# Patient Record
Sex: Male | Born: 1935 | Race: White | Hispanic: No | Marital: Married | Smoking: Never smoker
Health system: Southern US, Community
[De-identification: ages and names within clinical notes are randomized; demographics above are authoritative.]

## PROBLEM LIST (undated history)

## (undated) DIAGNOSIS — G709 Myoneural disorder, unspecified: Secondary | ICD-10-CM

## (undated) DIAGNOSIS — M1990 Unspecified osteoarthritis, unspecified site: Secondary | ICD-10-CM

## (undated) DIAGNOSIS — K219 Gastro-esophageal reflux disease without esophagitis: Secondary | ICD-10-CM

## (undated) DIAGNOSIS — E7800 Pure hypercholesterolemia, unspecified: Secondary | ICD-10-CM

## (undated) DIAGNOSIS — I10 Essential (primary) hypertension: Secondary | ICD-10-CM

## (undated) DIAGNOSIS — E039 Hypothyroidism, unspecified: Secondary | ICD-10-CM

## (undated) HISTORY — PX: JOINT REPLACEMENT: SHX530

## (undated) HISTORY — PX: CHOLECYSTECTOMY: SHX55

## (undated) HISTORY — PX: TOTAL HIP ARTHROPLASTY: SHX124

## (undated) HISTORY — PX: TRANSURETHRAL RESECTION OF PROSTATE: SHX73

## (undated) HISTORY — PX: COLONOSCOPY: SHX174

## (undated) HISTORY — PX: EYE SURGERY: SHX253

---

## 1997-09-30 ENCOUNTER — Other Ambulatory Visit: Admission: RE | Admit: 1997-09-30 | Discharge: 1997-09-30 | Payer: Self-pay | Admitting: Internal Medicine

## 1998-01-19 ENCOUNTER — Inpatient Hospital Stay (HOSPITAL_COMMUNITY): Admission: RE | Admit: 1998-01-19 | Discharge: 1998-01-21 | Payer: Self-pay | Admitting: Urology

## 2000-03-13 HISTORY — PX: SHOULDER ARTHROSCOPY: SHX128

## 2000-08-22 ENCOUNTER — Encounter: Payer: Self-pay | Admitting: Orthopedic Surgery

## 2000-08-29 ENCOUNTER — Inpatient Hospital Stay (HOSPITAL_COMMUNITY): Admission: RE | Admit: 2000-08-29 | Discharge: 2000-08-30 | Payer: Self-pay | Admitting: Orthopedic Surgery

## 2002-04-16 ENCOUNTER — Encounter: Payer: Self-pay | Admitting: Internal Medicine

## 2002-04-16 ENCOUNTER — Ambulatory Visit (HOSPITAL_COMMUNITY): Admission: RE | Admit: 2002-04-16 | Discharge: 2002-04-16 | Payer: Self-pay | Admitting: Internal Medicine

## 2003-03-14 HISTORY — PX: SHOULDER ARTHROSCOPY: SHX128

## 2003-04-17 ENCOUNTER — Encounter (INDEPENDENT_AMBULATORY_CARE_PROVIDER_SITE_OTHER): Payer: Self-pay | Admitting: Specialist

## 2003-04-17 ENCOUNTER — Inpatient Hospital Stay (HOSPITAL_COMMUNITY): Admission: RE | Admit: 2003-04-17 | Discharge: 2003-04-20 | Payer: Self-pay | Admitting: Urology

## 2003-06-10 ENCOUNTER — Encounter: Admission: RE | Admit: 2003-06-10 | Discharge: 2003-06-10 | Payer: Self-pay | Admitting: Orthopedic Surgery

## 2003-07-13 ENCOUNTER — Observation Stay (HOSPITAL_COMMUNITY): Admission: RE | Admit: 2003-07-13 | Discharge: 2003-07-14 | Payer: Self-pay | Admitting: Orthopedic Surgery

## 2005-06-19 ENCOUNTER — Ambulatory Visit: Payer: Self-pay | Admitting: Gastroenterology

## 2005-06-22 ENCOUNTER — Encounter (INDEPENDENT_AMBULATORY_CARE_PROVIDER_SITE_OTHER): Payer: Self-pay | Admitting: Specialist

## 2005-06-22 ENCOUNTER — Ambulatory Visit: Payer: Self-pay | Admitting: Gastroenterology

## 2008-05-22 ENCOUNTER — Encounter: Admission: RE | Admit: 2008-05-22 | Discharge: 2008-05-22 | Payer: Self-pay | Admitting: Neurology

## 2008-10-01 ENCOUNTER — Emergency Department (HOSPITAL_COMMUNITY): Admission: EM | Admit: 2008-10-01 | Discharge: 2008-10-01 | Payer: Self-pay | Admitting: Emergency Medicine

## 2008-12-19 ENCOUNTER — Encounter: Admission: RE | Admit: 2008-12-19 | Discharge: 2008-12-19 | Payer: Self-pay | Admitting: Internal Medicine

## 2010-06-19 LAB — URINALYSIS, ROUTINE W REFLEX MICROSCOPIC
Bilirubin Urine: NEGATIVE
Glucose, UA: NEGATIVE mg/dL
Hgb urine dipstick: NEGATIVE
Ketones, ur: NEGATIVE mg/dL
Protein, ur: NEGATIVE mg/dL
Urobilinogen, UA: 0.2 mg/dL (ref 0.0–1.0)

## 2010-06-19 LAB — URINE CULTURE: Colony Count: 60000

## 2010-06-19 LAB — DIFFERENTIAL
Basophils Absolute: 0 10*3/uL (ref 0.0–0.1)
Basophils Relative: 0 % (ref 0–1)
Eosinophils Relative: 4 % (ref 0–5)
Monocytes Absolute: 0.6 10*3/uL (ref 0.1–1.0)
Neutro Abs: 5.1 10*3/uL (ref 1.7–7.7)

## 2010-06-19 LAB — CBC
Hemoglobin: 12.2 g/dL — ABNORMAL LOW (ref 13.0–17.0)
MCHC: 34.5 g/dL (ref 30.0–36.0)
Platelets: 200 10*3/uL (ref 150–400)
RDW: 13.8 % (ref 11.5–15.5)

## 2010-06-19 LAB — POCT I-STAT, CHEM 8
Creatinine, Ser: 1.4 mg/dL (ref 0.4–1.5)
Glucose, Bld: 106 mg/dL — ABNORMAL HIGH (ref 70–99)
Hemoglobin: 11.9 g/dL — ABNORMAL LOW (ref 13.0–17.0)
Sodium: 142 mEq/L (ref 135–145)
TCO2: 29 mmol/L (ref 0–100)

## 2010-06-19 LAB — URINE MICROSCOPIC-ADD ON

## 2010-06-19 LAB — PROTIME-INR: Prothrombin Time: 14.4 seconds (ref 11.6–15.2)

## 2010-07-29 NOTE — H&P (Signed)
[facility]  Patient:    Robert Preston, Robert Preston                      MRN: [id number] Adm. Date:  08/29/00 Attending:  Ollen Gross, M.D. Dictator:   Druscilla Brownie. Shela Nevin, P.A. CC:         Marinus Maw, M.D.   History and Physical  DATE OF BIRTH:  04/04/1922  CHIEF COMPLAINT:  "Problems with my right shoulder."  PRESENT ILLNESS:  This 77 year old white male has been seen by Dr. Ollen Gross for continuing progressive problems concerning his right shoulder.  The patient has had on-and-off shoulder pain for a while and had a steroid injection in the past.  He was playing some golf in the end of February and felt a pop in the right shoulder as well as onset of pain; since then, he has had progressive problems with weakness, difficulty to abduct the shoulder.  MRI has shown a full-thickness and partially retracted tear of the distal supraspinatus tendon; effusion is also seen.  Due to these positive findings and the fact this is a very active gentleman, feel he would benefit from surgical intervention and being admitted for right shoulder arthroscopy.  PAST MEDICAL HISTORY:  This gentleman has really been in good health throughout his lifetime.  He has hypertension and hypercholesterolemia. Surgically, he has had bilateral total hip replacement arthroplasties, one in 1987 and one in 1994, a cholecystectomy in 1987 and TUR in 1998. Dr. Marinus Maw is his family physician.  CURRENT MEDICATIONS: 1. Zocor 80 mg one-half tab q.d. 2. Terazosin 10 mg one q.d.  ALLERGIES:  He has no medical allergies.  SOCIAL HISTORY:  The patient has two or three alcoholic beverages weekly, has no intake of tobacco products.  FAMILY HISTORY:  Positive for renal failure in his mother, coronary artery disease and MI in the father, sister with cancer and renal failure and sister with bacterial endocarditis, all four deceased.  REVIEW OF SYSTEMS:  CNS:  No  seizure disorder, paralysis, numbness or double vision.  RESPIRATORY:  No productive cough.  No hemoptysis.  No shortness of breath.  CARDIOVASCULAR:  No chest pain.  No angina.  No orthopnea. GASTROINTESTINAL:  No nausea, vomiting, melena or bloody stools. GENITOURINARY:  No discharge, dysuria or hematuria.  MUSCULOSKELETAL: Primarily in the present illness with the right shoulder.  PHYSICAL EXAMINATION:  GENERAL:  Alert and cooperative, friendly 77 year old white male whose vital signs are:  Blood pressure 160/76.  Pulse 74.  Respirations are 12.  HEENT:  Normocephalic.  PERRLA.  EOM intact.  Oropharynx is clear.  CHEST:  Clear to auscultation.  No rhonchi nor rales.  HEART:  Regular rate and rhythm.  No murmurs are heard.  ABDOMEN:  Soft, nontender.  Liver and spleen not felt.  Obese.  GENITALIA/RECTAL:  Not done, not pertinent to present illness.  EXTREMITIES:  The patient has pain with abduction and range of motion, has marked crepitus throughout range of motion.  Strength is 4/5 with respect to testing of the right rotator cuff.  ADMITTING DIAGNOSES: 1. Tear of the rotator cuff of the right shoulder with impingement and    acromioclavicular arthrosis. 2. Hypertension. 3. Hypercholesterolemia.  PLAN:  The patient will undergo a right shoulder arthroscopy with subacromial decompression, distal clavicle resection and rotator cuff repair.  He will probably have an overnight stay. DD:  08/22/00 TD:  08/22/00 Job: [id number]

## 2010-07-29 NOTE — Discharge Summary (Signed)
NAMESHAMOND, SKELTON                          ACCOUNT NO.:  [id number]   MEDICAL RECORD NO.:  [id number]                   PATIENT TYPE:  INP   LOCATION:  0379                                 FACILITY:  [facility]   PHYSICIAN:  Jamison Neighbor, M.D.               DATE OF BIRTH:  [birth year]   DATE OF ADMISSION:  04/17/2003  DATE OF DISCHARGE:  04/20/2003                                 DISCHARGE SUMMARY   DISCHARGE DIAGNOSES:  1. Benign prostatic hypertrophy with bladder outlet obstruction.  2. Hypertension.  3. Past history of joint replacement.   PRINCIPAL PROCEDURE:  TURP done on the day of admission.   HISTORY:  This 77 year old male has had problems with BPH and bladder outlet  obstruction and also had some problems with hematuria.  The patient was  recently evaluated in the office and was found to have __________ blood in  the urine and for that reason, undergo repeat cystoscopy.  The patient is  being admitted following that procedure.   The patient's past medical history, family history, social history, and  review of systems are well-defined in the hospital records.  Principal  findings include __________ and include a cholecystectomy, bilateral hip  surgery, right shoulder surgery, previous TURP, and LASIK surgery on the  eyes.   MEDICATIONS AT TIME OF ADMISSION:  Cardura, Zocor, and atenolol.  The  patient has stopped aspirin.   SOCIAL HISTORY:  Negative.  The patient does not use tobacco, drinks modest  amounts of alcohol.   REVIEW OF SYSTEMS:  Noncontributory.   The patient was taken to the operating room where he underwent a TURP.  The  patient had significant tissue that was removed.  Even though he had had a  previous TURP, there was major regrowth, primarily on one side.  The patient  was ready for discharge by April 20, 2003.  His Foley was removed, and he  was able to void without difficulty.  He was sent home with Septra and  Lorcet Plus and will return in  follow-up in two weeks' time.                                               Jamison Neighbor, M.D.    RJE/MEDQ  D:  05/11/2003  T:  05/11/2003  Job:  [id number]

## 2010-07-29 NOTE — Op Note (Signed)
Robert Preston, Robert Preston                          ACCOUNT NO.:  [id number]   MEDICAL RECORD NO.:  [id number]                   PATIENT TYPE:  AMB   LOCATION:  DAY                                  [facility]ACILITY:  [facility]   PHYSICIAN:  Jamison Neighbor, M.D.               DATE OF BIRTH:  03/15/1922   DATE OF PROCEDURE:  04/17/2003  DATE OF DISCHARGE:                                 OPERATIVE REPORT   SERVICE:  Urology.   PREOPERATIVE DIAGNOSES:  Benign prostatic hypertrophy with bladder outlet  obstruction. Secondary diagnosis is hematuria.   POSTOPERATIVE DIAGNOSES:  Benign prostatic hypertrophy with bladder outlet  obstruction. Secondary diagnosis is hematuria.   PROCEDURE:  Cystoscopy and TURP.   SURGEON:  Jamison Neighbor, M.D.   ANESTHESIA:  General.   COMPLICATIONS:  None.   DRAINS:  A 24 French three-way Foley catheter.   BRIEF HISTORY:  This 77 year old male had a TURP done back in 1999.  For  many years, he had excellent control of his urine with good outflow but  recently he has begun to develop problems with both bladder outlet  obstruction and the development of hematuria.  A rectal examination showed  there had been some regrowth of the prostate and a cystoscopic examination  late in 2004 demonstrated that the blood did appear to be coming from the  prostate. The patient was noted to have regrowth of prostate much more on  the left than on the right. The patient was started on Proscar which did  help to stop the hematuria but his bladder outlet symptoms have not improved  and he is now to the point where he is not emptying his bladder well. The  patient has requested a repeat TURP be performed. He understands the risks  and benefits of the procedure and gave informed consent.   DESCRIPTION OF PROCEDURE:  After successful induction of general anesthesia,  the patient was placed in the dorsal lithotomy position, prepped with  Betadine and draped in the usual sterile fashion.   The urethra was  calibrated at 30 [place] with [employer] sounds. The Olympus continuous flow  resectoscope sheath was then inserted into the bladder with a [place]lake  obturator. A __________ resectoscope was inserted with the 12 degree lens in  place. Inspection showed that the bladder was unremarkable, the ureters were  back from the bladder neck, the patient primarily had regrowth on the right  hand side with a very large lobe of tissue falling down from the lateral  aspect and filling much of the bladder outlet. The prostatic flow was good,  there was some regrowth on the right hand side. The resection started at the  1 o'clock position and extended down to the floor of the prostate. This  allowed for harvesting of all this recurrent tissue on the left hand side. A  small amount of tissue was removed from the  right hand side coming down to  the surgical capsule. Some apical tissue particularly across the floor was  removed as well as some anterior lobe tissue. All chips were irrigated from  the bladder, adequate hemostasis was obtained. Rectal examination showed  there was little if any residual tissue. Visual inspection showed very  little prostatic tissue remaining and a wide open prostatic fossa. The  bladder neck was slightly tight and for that reason was split with a Collins  knife in order to prevent bladder neck contracture. The ureters were not  imaged, the bladder was not undermined and the sphincter mechanism was  intact. The hemostasis was adequate, the resectoscope was removed, the  catheter was inserted and placed to straight drainage. This irrigated  freely. We noted the patient had some adherence of the foreskin and glans,  this was gently separated and Neosporin was applied. The patient tolerated  the procedure well and was taken to the recovery room in good condition.                                               Jamison Neighbor, M.D.    RJE/MEDQ  D:  04/17/2003  T:   04/17/2003  Job:  [id number]

## 2010-07-29 NOTE — Op Note (Signed)
Robert Preston, Robert Preston                          ACCOUNT NO.:  [id number]   MEDICAL RECORD NO.:  [id number]                   PATIENT TYPE:  OBV   LOCATION:  0098                                 FACILITY:  [facility]   PHYSICIAN:  Ollen Gross, M.D.                 DATE OF BIRTH:  [birth year]   DATE OF PROCEDURE:  07/13/2003  DATE OF DISCHARGE:                                 OPERATIVE REPORT   PREOPERATIVE DIAGNOSIS:  Recurrent tear right rotator cuff.   POSTOPERATIVE DIAGNOSIS:  Recurrent tear right rotator cuff.   PROCEDURE:  Repair of recurrent tear right rotator cuff.   SURGEON:  Ollen Gross, M.D.   ASSISTANT:  Avel Peace.   ANESTHESIA:  General plus interscalene block.   ESTIMATED BLOOD LOSS:  Minimal.   DRAINS:  None.   COMPLICATIONS:  None.   CONDITION:  Stable.   BRIEF CLINICAL NOTE:  Robert Preston is a 77 year old male who underwent a right  rotator cuff repair several years ago and has had increased pain and  weakness over the past 6-8 months.  He also had increased crepitus in his  shoulder.  Repeat MRI demonstrated a tear of the supraspinatus.  He presents  now for repair of the recurrent tear.   PROCEDURE IN DETAIL:  He had the successful administration of interscalene  block then general anesthetic.  He was placed sitting upright in a beach  chair positioner and his right upper extremity showed the girdle isolated  from his trunk with plastic drapes and prepped and draped in a usual sterile  fashion.  A previous incision is reutilized, cutting along the skin lines  from posterior to anterior at the mid acromial level.  The skin was cut with  a 10 blade through subcutaneous tissue to the level of the deltoid fascia  then circumferential subcu flaps were elevated.  We did a longitudinal cut  along the anterior edge of the acromion to subperiosteally elevate the  anterior soft tissue sleeve and get into the subacromial space.  There was  no evidence of any spurring at  the [facility] joint, which had been previously  resected, or under the acromion.  I removed some of the scar tissue in the  subacromial space and the leading edge of the supraspinatus was torn off of  the greater tuberosity with about a 2 x 2 cm tear.  The tissue was very  mobile and easily reapproximated back to the level of the greater  tuberosity.  I trimmed the edges of the supraspinatus then placed two Mitek  anchors into a trough at the greater tuberosity.  We passed the free edges  of the sutures to the free edges of the tendon and the tendon is advanced to  the trough.  One of the Mitek anchors felt as though it was pulling out as I  removed it and used the two Ethibond sutures to reattach  the tendon to bone  through holes that were created in the bone.  With the repair through the  tunnel, the repair was found to be very stable throughout a full range of  motion of the shoulder.  We then copiously irrigated the wound and  reattached the deltoid to the acromion with #1 Ethibond through drill holes  in the acromion.  The rest of the soft tissue sleeve was  reapproximated back tissue-to-tissue with the Ethibond.  Subcu was closed  with interrupted 2-0 Vicryl and subcuticular running 4-0 Monocryl.  Incisions cleaned and dried and Steri-Strips and bulky sterile dressing  applied.  He was placed into a shoulder immobilizer, awakened, transported  to recovery in stable condition.                                               Ollen Gross, M.D.    FA/MEDQ  D:  07/13/2003  T:  07/13/2003  Job:  [id number]

## 2010-07-29 NOTE — H&P (Signed)
NAMEJAYDIN, Robert Preston                          ACCOUNT NO.:  [id number]   MEDICAL RECORD NO.:  [id number]                   PATIENT TYPE:  INP   LOCATION:  0379                                 FACILITY:  [facility]   PHYSICIAN:  Jamison Neighbor, M.D.               DATE OF BIRTH:  May 31, 1922   DATE OF ADMISSION:  04/17/2003  DATE OF DISCHARGE:  04/20/2003                                HISTORY & PHYSICAL   HISTORY:  This 77 year old male is known to have recurrent bladder outlet  obstruction, status post TURP many years ago.  In addition, he has developed  some problems with hematuria.  His TURP was done in 1999 and for that  reason, he did very well for many years.  The patient is now to be admitted  following a TURP for bladder outlet obstruction.   PAST MEDICAL HISTORY:  1. Hypertension.  2. He is known to have an enlarged prostate but does have normal PSA and     episode of hematuria 2 months ago.  3. The patient has had right shoulder surgery, right hip surgery, left hip     surgery.  4. Cholecystectomy.  5. Previous TURP.  6. LASIK surgery on both eyes.   SOCIAL HISTORY:  Unremarkable.  He uses moderate alcohol, does not smoke.   FAMILY HISTORY:  Noncontributory.   REVIEW OF SYSTEMS:  Normal.   PHYSICAL EXAMINATION:  VITAL SIGNS:  Temperature 98.7, blood pressure  130/74, pulse 52, respirations 16.  HEENT:  Normocephalic and atraumatic.  Cranial nerves 2-12 are grossly  intact.  NECK:  Supple, no adenopathy or thyromegaly.  LUNGS:  Clear.  CARDIAC:  Regular rate and rhythm with no murmurs, thrills, gallops, rubs,  heaves.  ABDOMEN:  Soft, nontender.  No palpable masses, rebound, or guarding.  GU:  Testicles are normal in size, shape, and consistency with no hernia or  adenopathy.  The penis is free of any lesions.  RECTAL:  Midline prostate with somewhat irregular and enlarged following the  TURP.  EXTREMITIES:  No cyanosis, clubbing, or edema.   IMPRESSION:  Benign  prostatic hypertrophy with bladder outlet obstruction  and associated hematuria.   PLAN:  Admit following TURP.                                               Jamison Neighbor, M.D.   RJE/MEDQ  D:  05/11/2003  T:  05/11/2003  Job:  [id number]

## 2010-07-29 NOTE — Op Note (Signed)
[facility]  Patient:    Robert Preston, Robert Preston                       MRN: [id number] Proc. Date: 08/29/00 Adm. Date:  [id number] Attending:  Ollen Gross V                           Operative Report  PREOPERATIVE DIAGNOSIS:  Right shoulder impingement syndrome, acromioclavicular arthrosis, rotator cuff tear.  POSTOPERATIVE DIAGNOSIS:  Right shoulder impingement syndrome, acromioclavicular arthrosis, rotator cuff tear, plus labral tear.  PROCEDURE:  Right shoulder arthroscopy with labral debridement, open subacromial decompression just above the resection, and rotator cuff repair.  SURGEON:  Ollen Gross, M.D.  ASSISTANT:  Cherly Beach.  ANESTHESIA:  Interscalene plus general.  ESTIMATED BLOOD LOSS:  Minimal.  DRAINS:  None.  COMPLICATIONS:  None.  CONDITION:  Stable to recovery.  BRIEF CLINICAL NOTE:  The patient is a 77 year old male with significant right shoulder pain and dysfunction.  Exam and history were consistent with rotator cuff tear, confirmed by MRI.  He also had significantly degenerative AC joint and large subacromial spur.  He presents now for the above mentioned procedure.  DESCRIPTION OF PROCEDURE:  After the successful administration of interscalene block and general anesthetic, the patient was placed sitting upright in the beach chair position.  The right upper extremity and shoulder girdle isolated with _________ drapes, and prepped and draped in the usual sterile fashion. Arthroscopic landmarks are drawn and the incision made for the posterior portal.  The camera and cannula passed into the joint.  Arthroscopic visualization proceeds.  The superior labrum had significant fraying without detachment.  There is also fraying at the anterior labrum.  Biceps intact. Glenoid and humeral head chondral surfaces showed some minimal chondromalacia, but no significant degenerative change.  The surface of the rotator cuff visualized  and there was a large tear in the supra and infraspinatus with retraction.  At this point, the spinal needle was used to localize the anterior portals.  A small incision made, cannula passed, and a shaver placed to debride the labrum back to a stable base.  Given the size of the rotator cuff tear, it was decided to pursue the subacromial portion of this procedure, open.  Arthroscopic equipment was removed and the incision made along the skin line, coursing from the mid acromion anteriorly.  The skin was cut with the 10 blade through subcutaneous tissue to the level of the deltoid fascia and subcutaneous flaps were elevated in all directions.  The fascia over the clavicle was split longitudinally, all the way across the anterior aspect of the acromion to the deltoid fibers which were split in line for about 1.5 cm pass the lateral tip of the acromion.  The entire anterior soft tissue sleeve was elevated.  The posterior _______ of the sleeve was elevated over the clavicle.  He had a huge hypertrophy of the distal clavicle and degenerative change of the [facility] joint.  The distal centimeter of the clavicle was excised with an oscillating saw, and he still had some spurring of the under surface of the clavicle which was then removed with a rongeur and rasp.  The under surface of the clavicle was then flat.  The acromion also had a huge spur consistent with type 3 acromion.  This was removed to create a flat acromial under surface and a tremendous amount of thickened bursa which was removed.  The cuff was identified and there was a degenerative tear, about 2 x 2 cm in size.  It was debrided back to stable edges and then a trough created around the area of the greater tuberosity.  Two Mitek anchors were then placed into this trough and the free ends of the sutures are passed through the leading edge of the tendon which were advanced down through the trough.  The cuff is then oversewn with the  Ethibond.  This left a very stable repair.  The wound was then copiously irrigated with saline and the deltoid reattached to the acromion through drill holes with Ethibond.  The fascia over the flap was imbricated with the Ethibond and the deltoid split was closed with the Ethibond also.  The subcutaneous was closed with interrupted 2-0 Vicryl and subcuticular with running 4-0 Monocryl.  The incision is clean and dry, and Steri-Strips and a bulky sterile dressing applied.  The patient was then awakened and transported to recovery in stable condition. DD:  08/29/00 TD:  08/29/00 Job: [id number]

## 2011-02-15 ENCOUNTER — Emergency Department (HOSPITAL_COMMUNITY)
Admission: EM | Admit: 2011-02-15 | Discharge: 2011-02-15 | Disposition: A | Discharge status: Home / Self Care | Payer: Medicare Other | Attending: Emergency Medicine | Admitting: Emergency Medicine

## 2011-02-15 ENCOUNTER — Emergency Department (HOSPITAL_COMMUNITY): Payer: Medicare Other

## 2011-02-15 ENCOUNTER — Encounter: Payer: Self-pay | Admitting: *Deleted

## 2011-02-15 DIAGNOSIS — Z79899 Other long term (current) drug therapy: Secondary | ICD-10-CM | POA: Insufficient documentation

## 2011-02-15 DIAGNOSIS — W19XXXA Unspecified fall, initial encounter: Secondary | ICD-10-CM

## 2011-02-15 DIAGNOSIS — M25512 Pain in left shoulder: Secondary | ICD-10-CM

## 2011-02-15 DIAGNOSIS — M25559 Pain in unspecified hip: Secondary | ICD-10-CM | POA: Insufficient documentation

## 2011-02-15 DIAGNOSIS — Y92009 Unspecified place in unspecified non-institutional (private) residence as the place of occurrence of the external cause: Secondary | ICD-10-CM | POA: Insufficient documentation

## 2011-02-15 DIAGNOSIS — W010XXA Fall on same level from slipping, tripping and stumbling without subsequent striking against object, initial encounter: Secondary | ICD-10-CM | POA: Insufficient documentation

## 2011-02-15 DIAGNOSIS — M25529 Pain in unspecified elbow: Secondary | ICD-10-CM | POA: Insufficient documentation

## 2011-02-15 DIAGNOSIS — M25519 Pain in unspecified shoulder: Secondary | ICD-10-CM | POA: Insufficient documentation

## 2011-02-15 DIAGNOSIS — M25522 Pain in left elbow: Secondary | ICD-10-CM

## 2011-02-15 DIAGNOSIS — M25552 Pain in left hip: Secondary | ICD-10-CM

## 2011-02-15 MED ORDER — BACITRACIN-NEOMYCIN-POLYMYXIN 400-5-5000 EX OINT
TOPICAL_OINTMENT | CUTANEOUS | Status: AC
  Administered 2011-02-15: 1
  Filled 2011-02-15: qty 1

## 2011-02-15 NOTE — ED Provider Notes (Addendum)
History     CSN: [id number] Arrival date & time: 02/15/2011  7:05 AM   First MD Initiated Contact with Patient 02/15/11 [phone]      Chief Complaint  Patient presents with  . Fall  . Dislocation    (Consider location/radiation/quality/duration/timing/severity/associated sxs/prior treatment) HPI.... accidental fall in bathroom last night.  Complains of pain in left shoulder, left elbow, posterior left hip. No head trauma or neck pain. A she makes it worse.  Pain is sharp.  No past medical history on file.  No past surgical history on file.  No family history on file.  History  Substance Use Topics  . Smoking status: Not on file  . Smokeless tobacco: Not on file  . Alcohol Use: Not on file      Review of Systems  All other systems reviewed and are negative.    Allergies  Review of patient's allergies indicates not on file.  Home Medications   Current Outpatient Rx  Name Route Sig Dispense Refill  . ASPIRIN EC 81 MG PO TBEC Oral Take 81 mg by mouth daily.      Marland Kitchen VITAMIN D 2000 UNITS PO TABS Oral Take 2,000 Units by mouth daily.      . DONEPEZIL HCL 5 MG PO TABS Oral Take 5 mg by mouth daily.      Marland Kitchen FINASTERIDE 5 MG PO TABS Oral Take 5 mg by mouth daily.      Marland Kitchen LEVOTHYROXINE SODIUM 50 MCG PO TABS Oral Take 50 mcg by mouth daily.      Marland Kitchen LISINOPRIL 20 MG PO TABS Oral Take 20 mg by mouth daily.     Marland Kitchen LORATADINE 10 MG PO TABS Oral Take 10 mg by mouth daily.      Marland Kitchen SIMVASTATIN 80 MG PO TABS Oral Take 80 mg by mouth at bedtime.      . TERAZOSIN HCL 5 MG PO CAPS Oral Take 5 mg by mouth 2 (two) times daily.        BP 119/53  Pulse 65  Temp(Src) 98 F (36.7 C) (Oral)  Resp 18  SpO2 97%  Physical Exam  Nursing note and vitals reviewed. Constitutional: He is oriented to person, place, and time. He appears well-developed and well-nourished.  HENT:  Head: Normocephalic and atraumatic.  Eyes: Conjunctivae and EOM are normal. Pupils are equal, round, and reactive to  light.  Neck: Normal range of motion. Neck supple.  Cardiovascular: Normal rate and regular rhythm.   Pulmonary/Chest: Effort normal and breath sounds normal.  Abdominal: Soft. Bowel sounds are normal.  Musculoskeletal:       Tender. Left shoulder, left elbow tip, posterior left hip. Pain with range of motion  Neurological: He is alert and oriented to person, place, and time.  Skin: Skin is warm and dry.  Psychiatric: He has a normal mood and affect.    ED Course  Procedures (including critical care time)  Labs Reviewed - No data to display No results found.   No diagnosis found.    MDM  Patient is alert and oriented. No neurological deficits. No head or neck trauma.  Will x-ray painful areas   X-rays show no acute fracture.  Discussed with patient and wife. Followup with orthopedic     Donnetta Hutching, MD 02/15/11 1191  Donnetta Hutching, MD 02/15/11 1020

## 2011-02-15 NOTE — ED Notes (Signed)
Patient walks with cane,  Lives at home with wife.

## 2011-02-15 NOTE — ED Notes (Signed)
RUE:[id number]<JW> Expected date:02/15/11<BR> Expected time: 6:39 AM<BR> Means of arrival:Ambulance<BR> Comments:<BR> Shoulder injury

## 2011-03-16 ENCOUNTER — Emergency Department (HOSPITAL_COMMUNITY): Payer: Medicare Other

## 2011-03-16 ENCOUNTER — Encounter (HOSPITAL_COMMUNITY): Payer: Self-pay | Admitting: Emergency Medicine

## 2011-03-16 ENCOUNTER — Emergency Department (HOSPITAL_COMMUNITY)
Admission: EM | Admit: 2011-03-16 | Discharge: 2011-03-16 | Disposition: A | Discharge status: Home / Self Care | Payer: Medicare Other | Attending: Emergency Medicine | Admitting: Emergency Medicine

## 2011-03-16 DIAGNOSIS — W19XXXA Unspecified fall, initial encounter: Secondary | ICD-10-CM

## 2011-03-16 DIAGNOSIS — IMO0002 Reserved for concepts with insufficient information to code with codable children: Secondary | ICD-10-CM | POA: Insufficient documentation

## 2011-03-16 DIAGNOSIS — R296 Repeated falls: Secondary | ICD-10-CM | POA: Insufficient documentation

## 2011-03-16 DIAGNOSIS — I6789 Other cerebrovascular disease: Secondary | ICD-10-CM | POA: Insufficient documentation

## 2011-03-16 DIAGNOSIS — E7800 Pure hypercholesterolemia, unspecified: Secondary | ICD-10-CM | POA: Insufficient documentation

## 2011-03-16 DIAGNOSIS — T07XXXA Unspecified multiple injuries, initial encounter: Secondary | ICD-10-CM | POA: Insufficient documentation

## 2011-03-16 DIAGNOSIS — R5381 Other malaise: Secondary | ICD-10-CM | POA: Insufficient documentation

## 2011-03-16 DIAGNOSIS — R5383 Other fatigue: Secondary | ICD-10-CM | POA: Insufficient documentation

## 2011-03-16 DIAGNOSIS — M25519 Pain in unspecified shoulder: Secondary | ICD-10-CM | POA: Insufficient documentation

## 2011-03-16 DIAGNOSIS — I10 Essential (primary) hypertension: Secondary | ICD-10-CM | POA: Insufficient documentation

## 2011-03-16 DIAGNOSIS — R0602 Shortness of breath: Secondary | ICD-10-CM | POA: Insufficient documentation

## 2011-03-16 DIAGNOSIS — M5030 Other cervical disc degeneration, unspecified cervical region: Secondary | ICD-10-CM | POA: Insufficient documentation

## 2011-03-16 DIAGNOSIS — F411 Generalized anxiety disorder: Secondary | ICD-10-CM | POA: Insufficient documentation

## 2011-03-16 HISTORY — DX: Essential (primary) hypertension: I10

## 2011-03-16 HISTORY — DX: Pure hypercholesterolemia, unspecified: E78.00

## 2011-03-16 LAB — CBC
HCT: 36.7 % — ABNORMAL LOW (ref 39.0–52.0)
Hemoglobin: 12.7 g/dL — ABNORMAL LOW (ref 13.0–17.0)
MCH: 33.2 pg (ref 26.0–34.0)
MCHC: 34.6 g/dL (ref 30.0–36.0)
MCV: 96.1 fL (ref 78.0–100.0)

## 2011-03-16 LAB — DIFFERENTIAL
Basophils Relative: 0 % (ref 0–1)
Eosinophils Absolute: 0 10*3/uL (ref 0.0–0.7)
Eosinophils Relative: 0 % (ref 0–5)
Monocytes Absolute: 0.3 10*3/uL (ref 0.1–1.0)
Monocytes Relative: 5 % (ref 3–12)

## 2011-03-16 LAB — URINALYSIS, ROUTINE W REFLEX MICROSCOPIC
Bilirubin Urine: NEGATIVE
Hgb urine dipstick: NEGATIVE
Ketones, ur: 15 mg/dL — AB
Nitrite: NEGATIVE
Urobilinogen, UA: 1 mg/dL (ref 0.0–1.0)

## 2011-03-16 LAB — BASIC METABOLIC PANEL
BUN: 25 mg/dL — ABNORMAL HIGH (ref 6–23)
Creatinine, Ser: 1.02 mg/dL (ref 0.50–1.35)
GFR calc Af Amer: 75 mL/min — ABNORMAL LOW (ref >90)
GFR calc non Af Amer: 64 mL/min — ABNORMAL LOW (ref >90)

## 2011-03-16 NOTE — ED Notes (Signed)
Per EMS, pt from home, reports fall, pt was face down upon arrival to home.  Abrasion noted on pt's forehead per EMS.  Pt reports bila shoulder pain, more severe in his Left.  Pt is also c/o L hip pain.  Pt is A&O x 4.  Pt denies LOC/

## 2011-03-16 NOTE — ED Notes (Signed)
Pt was immobilized on a scoop stretcher and spider straps

## 2011-03-16 NOTE — ED Notes (Signed)
Bed:WHALA<BR> Expected date:03/16/11<BR> Expected time: 4:53 PM<BR> Means of arrival:Ambulance<BR> Comments:<BR> EMS 11 GC, 88 yom fall w hip and shoulder pain

## 2011-03-16 NOTE — ED Notes (Signed)
Pt received zofran 4mg  IV en route

## 2011-03-16 NOTE — ED Provider Notes (Signed)
History     CSN: [id number]  Arrival date & time 03/16/11  1715   First MD Initiated Contact with Patient 03/16/11 1803      Chief Complaint  Patient presents with  . Fall    (Consider location/radiation/quality/duration/timing/severity/associated sxs/prior treatment) HPI Robert Preston is a 78 y.o. male presents with c/o a fall, causing injury to head and left shoulder; leading to desire to be assessed in the ED. The sx(s) have been present for 1 hour. Additional concerns are that this is the third fall in 2 weeks. Causative factors are today, he felt nauseated stood up, felt weak and fell. Palliative factors are nothing. The distress associated is moderate. The disorder has been present for 1 hour. He injured his left shoulder in a fall, recently, was seen by his orthopedist, and referred to physical therapy. Today, he reinjured the same shoulder.   Past Medical History  Diagnosis Date  . Hypertension   . Hypercholesterolemia     Past Surgical History  Procedure Date  . Joint replacement     History reviewed. No pertinent family history.  History  Substance Use Topics  . Smoking status: Never Smoker   . Smokeless tobacco: Not on file  . Alcohol Use: Yes     occasionally      Review of Systems  All other systems reviewed and are negative.    Allergies  Review of patient's allergies indicates no known allergies.  Home Medications   Current Outpatient Rx  Name Route Sig Dispense Refill  . ASPIRIN EC 81 MG PO TBEC Oral Take 81 mg by mouth daily.      Marland Kitchen VITAMIN D 2000 UNITS PO TABS Oral Take 2,000 Units by mouth daily.      . DONEPEZIL HCL 5 MG PO TABS Oral Take 5 mg by mouth daily.      Marland Kitchen FINASTERIDE 5 MG PO TABS Oral Take 5 mg by mouth daily.      Marland Kitchen LEVOTHYROXINE SODIUM 50 MCG PO TABS Oral Take 50 mcg by mouth daily.      Marland Kitchen LISINOPRIL 20 MG PO TABS Oral Take 20 mg by mouth daily.     Marland Kitchen LORATADINE 10 MG PO TABS Oral Take 10 mg by mouth daily.      Marland Kitchen  SIMVASTATIN 80 MG PO TABS Oral Take 80 mg by mouth at bedtime.      . TERAZOSIN HCL 5 MG PO CAPS Oral Take 5 mg by mouth 2 (two) times daily.        BP 149/55  Pulse 75  Resp 20  SpO2 99%  Physical Exam  Nursing note and vitals reviewed. Constitutional: He is oriented to person, place, and time. He appears well-developed and well-nourished.  HENT:  Head: Normocephalic and atraumatic.  Right Ear: External ear normal.  Left Ear: External ear normal.       He has flat contusions of the forehead bilaterally, no visible skin break or laceration.  Eyes: Conjunctivae and EOM are normal. Pupils are equal, round, and reactive to light.  Neck: Normal range of motion and phonation normal. Neck supple.  Cardiovascular: Normal rate, regular rhythm, normal heart sounds and intact distal pulses.   Pulmonary/Chest: Effort normal and breath sounds normal. He exhibits no bony tenderness.       Mild diffuse left posterior chest wall tenderness, without crepitation, swelling, or ecchymosis  Abdominal: Soft. Normal appearance. He exhibits no mass. There is no tenderness. There is no guarding.  Musculoskeletal:  He exhibits tenderness. He exhibits no edema.       Left shoulder, tender, without deformity, resist passive range of motion due to pain.  Neurological: He is alert and oriented to person, place, and time. He has normal strength. No cranial nerve deficit or sensory deficit. He exhibits normal muscle tone. Coordination normal.  Skin: Skin is warm, dry and intact.  Psychiatric:       He is anxious, and tearful at times. His periods of agitation characterized by swearing.    ED Course  Procedures (including critical care time)  Labs Reviewed  CBC - Abnormal; Notable for the following:    RBC 3.82 (*)    Hemoglobin 12.7 (*)    HCT 36.7 (*)    All other components within normal limits  DIFFERENTIAL - Abnormal; Notable for the following:    Neutrophils Relative 85 (*)    Lymphocytes Relative 10  (*)    All other components within normal limits  BASIC METABOLIC PANEL - Abnormal; Notable for the following:    Glucose, Bld 163 (*)    BUN 25 (*)    GFR calc non Af Amer 64 (*)    GFR calc Af Amer 75 (*)    All other components within normal limits  URINALYSIS, ROUTINE W REFLEX MICROSCOPIC - Abnormal; Notable for the following:    Ketones, ur 15 (*)    All other components within normal limits  URINE CULTURE   Dg Chest 2 View  03/16/2011  *RADIOLOGY REPORT*  Clinical Data: Fall, left shoulder pain.  Shortness of breath.  CHEST - 2 VIEW  Comparison: 10/01/2008  Findings: Mild cardiomegaly.  No confluent airspace opacities or effusions.  Severe degenerative changes in the shoulders bilaterally, left greater than right.  No acute bony abnormality. No pneumothorax.  IMPRESSION: No acute cardiopulmonary disease.  Original Report Authenticated By: Cyndie Chime, M.D.   Ct Head Wo Contrast  03/16/2011  *RADIOLOGY REPORT*  Clinical Data:  Fall.  CT HEAD WITHOUT CONTRAST CT CERVICAL SPINE WITHOUT CONTRAST  Technique:  Multidetector CT imaging of the head and cervical spine was performed following the standard protocol without intravenous contrast.  Multiplanar CT image reconstructions of the cervical spine were also generated.  Comparison:  10/02/2010  CT HEAD  Findings: There is atrophy and chronic small vessel disease changes. No acute intracranial abnormality.  Specifically, no hemorrhage, hydrocephalus, mass lesion, acute infarction, or significant intracranial injury.  No acute calvarial abnormality. Visualized paranasal sinuses and mastoids clear.  Orbital soft tissues unremarkable.  IMPRESSION: No acute intracranial abnormality.  Atrophy, chronic microvascular disease.  CT CERVICAL SPINE  Findings: Diffuse degenerative disc disease and facet disease as seen previously.  Alignment is normal.  Prevertebral soft tissues are normal.  Congenital nonunion of the posterior arch of C1.  No fracture.   IMPRESSION: Multilevel spondylosis.  No acute findings.  Original Report Authenticated By: Cyndie Chime, M.D.   Ct Cervical Spine Wo Contrast  03/16/2011  *RADIOLOGY REPORT*  Clinical Data:  Fall.  CT HEAD WITHOUT CONTRAST CT CERVICAL SPINE WITHOUT CONTRAST  Technique:  Multidetector CT imaging of the head and cervical spine was performed following the standard protocol without intravenous contrast.  Multiplanar CT image reconstructions of the cervical spine were also generated.  Comparison:  10/02/2010  CT HEAD  Findings: There is atrophy and chronic small vessel disease changes. No acute intracranial abnormality.  Specifically, no hemorrhage, hydrocephalus, mass lesion, acute infarction, or significant intracranial injury.  No acute calvarial  abnormality. Visualized paranasal sinuses and mastoids clear.  Orbital soft tissues unremarkable.  IMPRESSION: No acute intracranial abnormality.  Atrophy, chronic microvascular disease.  CT CERVICAL SPINE  Findings: Diffuse degenerative disc disease and facet disease as seen previously.  Alignment is normal.  Prevertebral soft tissues are normal.  Congenital nonunion of the posterior arch of C1.  No fracture.  IMPRESSION: Multilevel spondylosis.  No acute findings.  Original Report Authenticated By: Cyndie Chime, M.D.   Dg Shoulder Left  03/16/2011  *RADIOLOGY REPORT*  Clinical Data: Fall, left shoulder pain.  LEFT SHOULDER - 2+ VIEW  Comparison: None  Findings: Severe degenerative changes in the left shoulder.  This involves both the [facility] and glenohumeral joints. No acute bony abnormality.  Specifically, no fracture, subluxation, or dislocation.  Soft tissues are intact.  IMPRESSION: No acute bony abnormality.  Severe degenerative changes.  Original Report Authenticated By: Cyndie Chime, M.D.   21:21- 9:25 PM Reevaluation with update and discussion. After initial assessment and treatment, an updated evaluation reveals patient is stable, comfortable. Eamonn Sermeno  L    1. Fall   2. Contusion, multiple sites       MDM  Fall with weakness, likely related to mild dehydration. No significant metabolic abnormality, and no urinary tract infection. patient has moderate degenerative change left shoulder and cervical spine that may be complicating his condition.      Flint Melter, MD 03/16/11 2126

## 2011-03-18 LAB — URINE CULTURE: Culture  Setup Time: 201301040210

## 2011-03-21 ENCOUNTER — Ambulatory Visit: Payer: PRIVATE HEALTH INSURANCE | Admitting: Physical Therapy

## 2011-03-28 ENCOUNTER — Ambulatory Visit: Payer: Self-pay | Admitting: Physical Therapy

## 2011-04-24 ENCOUNTER — Other Ambulatory Visit: Payer: Self-pay | Admitting: Orthopedic Surgery

## 2011-05-04 ENCOUNTER — Encounter (HOSPITAL_BASED_OUTPATIENT_CLINIC_OR_DEPARTMENT_OTHER): Payer: Self-pay | Admitting: *Deleted

## 2011-05-04 ENCOUNTER — Encounter (HOSPITAL_BASED_OUTPATIENT_CLINIC_OR_DEPARTMENT_OTHER)
Admission: RE | Admit: 2011-05-04 | Discharge: 2011-05-04 | Disposition: A | Discharge status: Home / Self Care | Payer: Medicare Other | Source: Ambulatory Visit | Attending: Orthopedic Surgery | Admitting: Orthopedic Surgery

## 2011-05-04 NOTE — Progress Notes (Signed)
Talked with wife-pt has dementia-cooperative To come in for bmet-ekg-had cxr in er after fall pcp stopped one bp med

## 2011-05-08 ENCOUNTER — Other Ambulatory Visit: Payer: Self-pay

## 2011-05-08 ENCOUNTER — Encounter (HOSPITAL_BASED_OUTPATIENT_CLINIC_OR_DEPARTMENT_OTHER)
Admission: RE | Admit: 2011-05-08 | Discharge: 2011-05-08 | Disposition: A | Discharge status: Home / Self Care | Payer: Medicare Other | Source: Ambulatory Visit | Attending: Orthopedic Surgery | Admitting: Orthopedic Surgery

## 2011-05-08 LAB — BASIC METABOLIC PANEL
CO2: 30 mEq/L (ref 19–32)
Calcium: 10.1 mg/dL (ref 8.4–10.5)
Chloride: 105 mEq/L (ref 96–112)
GFR calc Af Amer: 70 mL/min — ABNORMAL LOW (ref >90)
Sodium: 141 mEq/L (ref 135–145)

## 2011-05-10 ENCOUNTER — Encounter (HOSPITAL_BASED_OUTPATIENT_CLINIC_OR_DEPARTMENT_OTHER): Payer: Self-pay | Admitting: Certified Registered Nurse Anesthetist

## 2011-05-10 ENCOUNTER — Encounter (HOSPITAL_BASED_OUTPATIENT_CLINIC_OR_DEPARTMENT_OTHER): Payer: Self-pay | Admitting: Orthopedic Surgery

## 2011-05-10 ENCOUNTER — Encounter (HOSPITAL_BASED_OUTPATIENT_CLINIC_OR_DEPARTMENT_OTHER)
Admission: RE | Disposition: A | Discharge status: Home Health | Payer: Self-pay | Source: Ambulatory Visit | Attending: Orthopedic Surgery

## 2011-05-10 ENCOUNTER — Ambulatory Visit (HOSPITAL_BASED_OUTPATIENT_CLINIC_OR_DEPARTMENT_OTHER)
Admission: RE | Admit: 2011-05-10 | Discharge: 2011-05-10 | Disposition: A | Discharge status: Home Health | Payer: Medicare Other | Source: Ambulatory Visit | Attending: Orthopedic Surgery | Admitting: Orthopedic Surgery

## 2011-05-10 ENCOUNTER — Ambulatory Visit (HOSPITAL_BASED_OUTPATIENT_CLINIC_OR_DEPARTMENT_OTHER): Payer: Medicare Other | Admitting: Certified Registered Nurse Anesthetist

## 2011-05-10 DIAGNOSIS — G5600 Carpal tunnel syndrome, unspecified upper limb: Secondary | ICD-10-CM | POA: Insufficient documentation

## 2011-05-10 DIAGNOSIS — Z01810 Encounter for preprocedural cardiovascular examination: Secondary | ICD-10-CM | POA: Insufficient documentation

## 2011-05-10 DIAGNOSIS — I10 Essential (primary) hypertension: Secondary | ICD-10-CM | POA: Insufficient documentation

## 2011-05-10 DIAGNOSIS — Z01812 Encounter for preprocedural laboratory examination: Secondary | ICD-10-CM | POA: Insufficient documentation

## 2011-05-10 HISTORY — PX: CARPAL TUNNEL RELEASE: SHX101

## 2011-05-10 HISTORY — DX: Myoneural disorder, unspecified: G70.9

## 2011-05-10 HISTORY — DX: Hypothyroidism, unspecified: E03.9

## 2011-05-10 HISTORY — DX: Unspecified osteoarthritis, unspecified site: M19.90

## 2011-05-10 SURGERY — CARPAL TUNNEL RELEASE
Anesthesia: Monitor Anesthesia Care | Site: Wrist | Laterality: Right | Wound class: Clean

## 2011-05-10 MED ORDER — BUPIVACAINE HCL (PF) 0.25 % IJ SOLN
INTRAMUSCULAR | Status: DC | PRN
  Administered 2011-05-10: 6 mL

## 2011-05-10 MED ORDER — DEXAMETHASONE SODIUM PHOSPHATE 4 MG/ML IJ SOLN
INTRAMUSCULAR | Status: DC | PRN
  Administered 2011-05-10: 4 mg via INTRAVENOUS

## 2011-05-10 MED ORDER — HYDROCODONE-ACETAMINOPHEN 5-500 MG PO TABS: 1.0000 | ORAL_TABLET | ORAL | Status: AC | PRN

## 2011-05-10 MED ORDER — LIDOCAINE HCL (CARDIAC) 20 MG/ML IV SOLN
INTRAVENOUS | Status: DC | PRN
  Administered 2011-05-10: 60 mg via INTRAVENOUS

## 2011-05-10 MED ORDER — MORPHINE SULFATE 4 MG/ML IJ SOLN: 0.0500 mg/kg | INTRAMUSCULAR | Status: DC | PRN

## 2011-05-10 MED ORDER — PROPOFOL 10 MG/ML IV EMUL
INTRAVENOUS | Status: DC | PRN
  Administered 2011-05-10: 100 ug/kg/min via INTRAVENOUS

## 2011-05-10 MED ORDER — FENTANYL CITRATE 0.05 MG/ML IJ SOLN: 25.0000 ug | INTRAMUSCULAR | Status: DC | PRN

## 2011-05-10 MED ORDER — CHLORHEXIDINE GLUCONATE 4 % EX LIQD: 60.0000 mL | Freq: Once | CUTANEOUS | Status: DC

## 2011-05-10 MED ORDER — CEFAZOLIN SODIUM-DEXTROSE 2-3 GM-% IV SOLR
2.0000 g | INTRAVENOUS | Status: AC
  Administered 2011-05-10: 2 g via INTRAVENOUS

## 2011-05-10 MED ORDER — LACTATED RINGERS IV SOLN
INTRAVENOUS | Status: DC
  Administered 2011-05-10: 10:00:00 via INTRAVENOUS

## 2011-05-10 MED ORDER — METOCLOPRAMIDE HCL 5 MG/ML IJ SOLN: 10.0000 mg | Freq: Once | INTRAMUSCULAR | Status: DC | PRN

## 2011-05-10 MED ORDER — FENTANYL CITRATE 0.05 MG/ML IJ SOLN
INTRAMUSCULAR | Status: DC | PRN
  Administered 2011-05-10: 50 ug via INTRAVENOUS

## 2011-05-10 MED ORDER — CEFAZOLIN SODIUM 1-5 GM-% IV SOLN: 1.0000 g | INTRAVENOUS | Status: DC

## 2011-05-10 MED ORDER — LIDOCAINE HCL (PF) 0.5 % IJ SOLN
INTRAMUSCULAR | Status: DC | PRN
  Administered 2011-05-10: 30 mL via INTRATHECAL

## 2011-05-10 SURGICAL SUPPLY — 35 items
BANDAGE GAUZE ELAST BULKY 4 IN (GAUZE/BANDAGES/DRESSINGS) ×2 IMPLANT
BLADE SURG 15 STRL LF DISP TIS (BLADE) ×1 IMPLANT
BLADE SURG 15 STRL SS (BLADE) ×2
BNDG CMPR 9X4 STRL LF SNTH (GAUZE/BANDAGES/DRESSINGS)
BNDG COHESIVE 3X5 TAN STRL LF (GAUZE/BANDAGES/DRESSINGS) ×2 IMPLANT
BNDG ESMARK 4X9 LF (GAUZE/BANDAGES/DRESSINGS) IMPLANT
CHLORAPREP W/TINT 26ML (MISCELLANEOUS) ×2 IMPLANT
CLOTH BEACON ORANGE TIMEOUT ST (SAFETY) ×2 IMPLANT
CORDS BIPOLAR (ELECTRODE) ×2 IMPLANT
COVER MAYO STAND STRL (DRAPES) ×2 IMPLANT
COVER TABLE BACK 60X90 (DRAPES) ×2 IMPLANT
CUFF TOURNIQUET SINGLE 18IN (TOURNIQUET CUFF) ×2 IMPLANT
DRAPE EXTREMITY T 121X128X90 (DRAPE) ×2 IMPLANT
DRAPE SURG 17X23 STRL (DRAPES) ×2 IMPLANT
DRSG KUZMA FLUFF (GAUZE/BANDAGES/DRESSINGS) ×2 IMPLANT
GAUZE XEROFORM 1X8 LF (GAUZE/BANDAGES/DRESSINGS) ×2 IMPLANT
GLOVE BIO SURGEON STRL SZ 6.5 (GLOVE) ×2 IMPLANT
GLOVE SURG ORTHO 8.0 STRL STRW (GLOVE) ×2 IMPLANT
GOWN BRE IMP PREV XXLGXLNG (GOWN DISPOSABLE) ×2 IMPLANT
GOWN PREVENTION PLUS XLARGE (GOWN DISPOSABLE) ×2 IMPLANT
NEEDLE 27GAX1X1/2 (NEEDLE) ×1 IMPLANT
NS IRRIG 1000ML POUR BTL (IV SOLUTION) ×2 IMPLANT
PACK BASIN DAY SURGERY FS (CUSTOM PROCEDURE TRAY) ×2 IMPLANT
PAD CAST 3X4 CTTN HI CHSV (CAST SUPPLIES) ×1 IMPLANT
PADDING CAST ABS 4INX4YD NS (CAST SUPPLIES) ×1
PADDING CAST ABS COTTON 4X4 ST (CAST SUPPLIES) ×1 IMPLANT
PADDING CAST COTTON 3X4 STRL (CAST SUPPLIES) ×2
SPONGE GAUZE 4X4 12PLY (GAUZE/BANDAGES/DRESSINGS) ×2 IMPLANT
STOCKINETTE 4X48 STRL (DRAPES) ×2 IMPLANT
SUT VICRYL 4-0 PS2 18IN ABS (SUTURE) IMPLANT
SUT VICRYL RAPIDE 4/0 PS 2 (SUTURE) ×2 IMPLANT
SYR BULB 3OZ (MISCELLANEOUS) ×2 IMPLANT
SYR CONTROL 10ML LL (SYRINGE) ×1 IMPLANT
TOWEL OR 17X24 6PK STRL BLUE (TOWEL DISPOSABLE) ×2 IMPLANT
UNDERPAD 30X30 INCONTINENT (UNDERPADS AND DIAPERS) ×2 IMPLANT

## 2011-05-10 NOTE — Brief Op Note (Signed)
05/10/2011  10:56 AM  PATIENT:  Robert Preston  78 y.o. male  PRE-OPERATIVE DIAGNOSIS:  right cts  POST-OPERATIVE DIAGNOSIS:  Right Carpal tunnel Syndrome  PROCEDURE:  Procedure(s) (LRB): CARPAL TUNNEL RELEASE (Right)  SURGEON:  Surgeon(s) and Role:    * Nicki Reaper, MD - Primary  PHYSICIAN ASSISTANT:   ASSISTANTS: none   ANESTHESIA:   local and regional  EBL:  Total I/O In: 500 [I.V.:500] Out: -   BLOOD ADMINISTERED:none  DRAINS: none   LOCAL MEDICATIONS USED:  MARCAINE     SPECIMEN:  No Specimen  DISPOSITION OF SPECIMEN:  N/A  COUNTS:  YES  TOURNIQUET:   Total Tourniquet Time Documented: Forearm (Right) - 17 minutes  DICTATION: .Other Dictation: Dictation Number [id number]  PLAN OF CARE: Discharge to home after PACU  PATIENT DISPOSITION:  PACU - hemodynamically stable.

## 2011-05-10 NOTE — H&P (Signed)
Robert Preston is an 78 year old right hand dominant male who comes in complaining of right hand pain, numbness and decreased strength. It has been going on gradually for several years. He complains of constant, moderate, dull, aching pain and numbness with a feeling of weakness. He states it is getting worse. He is not complaining of significant problems on his left side. It wakes him from sleep 7 out of 7 nights. He has thumb through ring fingers involved. He has no history of injury to the hand or neck. He states that heat will help. Activity makes this worse. He has been wearing a brace. He has a history of arthritis and questionable gout. He has no history of diabetes or thyroid problems.  PAST MEDICAL HISTORY: He has no allergies. He does not know what medicines he is on. His wife says she will bring a list of what he is taking. He is not taking any blood thinners at the present time. He has had both hips replaced and shoulder surgery. He sees Dr. Ophelia Preston. FAMILY MEDICAL HISTORY: Positive for high BP and arthritis. SOCIAL HISTORY: He does not smoke or drink. He is married and retired. REVIEW OF SYSTEMS:  Positive for weight loss, glasses, hearing loss, high BP, otherwise negative for 14 points. Robert Preston is an 78 y.o. male.   Chief Complaint: CTS HPI: see above  Past Medical History  Diagnosis Date  . Hypertension   . Hypercholesterolemia   . DEMENTIA   . Arthritis   . Hypothyroidism   . Neuromuscular disorder     carpal tunnel    Past Surgical History  Procedure Date  . Joint replacement   . Eye surgery     lasik both eyes  . Transurethral resection of prostate [id number]  . Shoulder arthroscopy 2005    right  . Shoulder arthroscopy 2002    rt  . Total hip arthroplasty     right and left  . Cholecystectomy   . Colonoscopy     History reviewed. No pertinent family history. Social History:  reports that he has never smoked. He does not have any smokeless tobacco history  on file. He reports that he drinks alcohol. He reports that he does not use illicit drugs.  Allergies: No Known Allergies  No current facility-administered medications on file as of .   Medications Prior to Admission  Medication Sig Dispense Refill  . memantine (NAMENDA) 10 MG tablet Take 10 mg by mouth daily.      Marland Kitchen aspirin EC 81 MG tablet Take 81 mg by mouth daily.        . Cholecalciferol (VITAMIN D) 2000 UNITS tablet Take 2,000 Units by mouth daily.        Marland Kitchen donepezil (ARICEPT) 5 MG tablet Take 5 mg by mouth daily.        . finasteride (PROSCAR) 5 MG tablet Take 5 mg by mouth daily.        Marland Kitchen levothyroxine (SYNTHROID, LEVOTHROID) 50 MCG tablet Take 50 mcg by mouth daily.        Marland Kitchen lisinopril (PRINIVIL,ZESTRIL) 20 MG tablet Take 20 mg by mouth daily.       Marland Kitchen loratadine (CLARITIN) 10 MG tablet Take 10 mg by mouth daily.        . simvastatin (ZOCOR) 80 MG tablet Take 80 mg by mouth at bedtime.        Marland Kitchen terazosin (HYTRIN) 5 MG capsule Take 5 mg by mouth 2 (two) times daily.  Results for orders placed during the hospital encounter of 05/10/11 (from the past 48 hour(s))  BASIC METABOLIC PANEL     Status: Abnormal   Collection Time   05/08/11  3:43 PM      Component Value Range Comment   Sodium 141  135 - 145 (mEq/L)    Potassium 4.6  3.5 - 5.1 (mEq/L)    Chloride 105  96 - 112 (mEq/L)    CO2 30  19 - 32 (mEq/L)    Glucose, Bld 92  70 - 99 (mg/dL)    BUN 22  6 - 23 (mg/dL)    Creatinine, Ser 1.61  0.50 - 1.35 (mg/dL)    Calcium 09.6  8.4 - 10.5 (mg/dL)    GFR calc non Af Amer 61 (*) >90 (mL/min)    GFR calc Af Amer 70 (*) >90 (mL/min)     No results found.   Pertinent items are noted in HPI.  Height 5\' 9"  (1.753 m), weight 81.647 kg (180 lb).  General appearance: alert, cooperative and appears stated age Head: Normocephalic, without obvious abnormality Neck: no adenopathy Resp: clear to auscultation bilaterally Cardio: regular rate and rhythm, S1, S2 normal, no  murmur, click, rub or gallop GI: soft, non-tender; bowel sounds normal; no masses,  no organomegaly Extremities: extremities normal, atraumatic, no cyanosis or edema Pulses: 2+ and symmetric Skin: Skin color, texture, turgor normal. No rashes or lesions Neurologic: Grossly normal Incision/Wound: na  Assessment/Plan His nerve conductions reveal a motor delay of 11.5 on his right side, 5.6 on his left.  Sensory delays commensurate with his motor delays. We have discussed with him and his wife the possibility of surgical decompression to the median nerve right side.   The pre, peri and postoperative course were discussed along with the risks and complications.  The patient is aware there is no guarantee with the surgery, possibility of infection, recurrence, injury to arteries, nerves, tendons, incomplete relief of symptoms and dystrophy.   He will be scheduled for right carpal tunnel release as an outpatient at [facility] at their request.  [name] R 05/10/2011, 8:48 AM

## 2011-05-10 NOTE — Transfer of Care (Signed)
Immediate Anesthesia Transfer of Care Note  Patient: Robert Preston  Procedure(s) Performed: Procedure(s) (LRB): CARPAL TUNNEL RELEASE (Right)  Patient Location: PACU  Anesthesia Type: MAC combined with regional for post-op pain  Level of Consciousness: awake, alert , oriented and patient cooperative  Airway & Oxygen Therapy: Patient Spontanous Breathing  Post-op Assessment: Report given to PACU RN and Post -op Vital signs reviewed and stable  Post vital signs: Reviewed and stable  Complications: No apparent anesthesia complications

## 2011-05-10 NOTE — Op Note (Signed)
NAME:  YEE, JOSS NO.:  MEDICAL RECORD NO.:  [id number]  LOCATION:                                 FACILITY:  PHYSICIAN:  Cindee Salt, M.D.       DATE OF BIRTH:  09-21-1922  DATE OF PROCEDURE:  05/10/2011 DATE OF DISCHARGE:                              OPERATIVE REPORT   PREOPERATIVE DIAGNOSIS:  Carpal tunnel syndrome, right hand.  POSTOPERATIVE DIAGNOSIS:  Carpal tunnel syndrome, right hand.  OPERATION:  Decompression of right median nerve.  SURGEON:  Cindee Salt, M.D.  ANESTHESIA:  Forearm-based IV regional local infiltration.  ANESTHESIOLOGIST:  Janetta Hora. Gelene Mink, M.D.  HISTORY:  The patient is an 78 year old male with a history of carpal tunnel syndrome, EMG nerve conduction is positive, which has not responded to conservative treatment.  He has elected to undergo surgical decompression.  Pre, peri, postop course and discussed along with risks and complications.  He is aware that there is no guarantee with the surgery, possibility of infection, recurrence, injury to arteries, nerves, tendons, incomplete relief of symptoms, dystrophy.  In preoperative area, the patient is seen, the extremity marked by both the patient and surgeon.  Antibiotic given.  PROCEDURE:  The patient was brought to the operating room where forearm- based IV regional anesthetic was carried out without difficulty, was prepped using ChloraPrep, supine position with the right arm free. Three minutes dry time was allowed.  Time-out taken, confirming the patient, procedure.  Longitudinal incision was made in the palm, carried down through subcutaneous tissue.  Bleeders were electrocauterized. Palmar fascia was split.  Superficial palmar arch identified, the flexor tendon to the ring little finger identified to the ulnar side of the median nerve.  Carpal retinaculum was incised with sharp dissection. Right angle and Sewall retractor were placed between skin and  forearm fascia.  The fascia released for approximately a centimeter and half proximal to the wrist crease under direct vision.  The canal was explored.  Air compression to the nerve was apparent.  No further lesions were identified.  The wound was irrigated.  Skin closed with interrupted 5-0 Vicryl Rapide sutures.  A local infiltration with 0.25% Marcaine without epinephrine was given approximately 6 mL was used.  On deflation of the tourniquet, all fingers immediately pinked.  He was taken to the recovery room for observation in satisfactory condition.          ______________________________ Cindee Salt, M.D.     GK/MEDQ  D:  05/10/2011  T:  05/10/2011  Job:  [id number]

## 2011-05-10 NOTE — Op Note (Signed)
dictated number: [id number]

## 2011-05-10 NOTE — Discharge Instructions (Addendum)
[facility] Instructions Hand Surgery  Wound Care: Keep your hand elevated above the level of your heart.  Do not allow it to dangle  by your side.  Keep the dressing dry and do not remove it unless your doctor advises you to do so.  He will usually change it at the time of your post-op visit.  Moving your fingers is advised to stimulate circulation but will depend on the site of your surgery.  If you have a splint applied, your doctor will advise you regarding movement.  Activity: Do not drive or operate machinery today.  Rest today and then you may return to your normal activity and work as indicated by your physician.  Diet:  Drink liquids today or eat a light diet.  You may resume a regular diet tomorrow.    General expectations: Pain for two to three days. Fingers may become slightly swollen.  Call your doctor if any of the following occur: Severe pain not relieved by pain medication. Elevated temperature. Dressing soaked with blood. Inability to move fingers. White or bluish color to fingers.[facility]  [address] [place], [place] [address] [phone]   Post Anesthesia Home Care Instructions  Activity: Get plenty of rest for the remainder of the day. A responsible adult should stay with you for 24 hours following the procedure.  For the next 24 hours, DO NOT: -Drive a car -Operate machinery -Drink alcoholic beverages -Take any medication unless instructed by your physician -Make any legal decisions or sign important papers.  Meals: Start with liquid foods such as gelatin or soup. Progress to regular foods as tolerated. Avoid greasy, spicy, heavy foods. If nausea and/or vomiting occur, drink only clear liquids until the nausea and/or vomiting subsides. Call your physician if vomiting continues.  Special Instructions/Symptoms: Your throat may feel dry or sore from the anesthesia or the breathing tube placed in your throat during surgery. If  this causes discomfort, gargle with warm salt water. The discomfort should disappear within 24 hours.   

## 2011-05-10 NOTE — Anesthesia Preprocedure Evaluation (Signed)
Anesthesia Evaluation  Patient identified by MRN, date of birth, ID band Patient awake    Reviewed: Allergy & Precautions, H&P , NPO status , Patient's Chart, lab work & pertinent test results, reviewed documented beta blocker date and time   Airway Mallampati: II TM Distance: >3 FB Neck ROM: full    Dental   Pulmonary neg pulmonary ROS,          Cardiovascular hypertension, On Medications     Neuro/Psych PSYCHIATRIC DISORDERS  Neuromuscular disease    GI/Hepatic negative GI ROS, Neg liver ROS,   Endo/Other  Negative Endocrine ROS  Renal/GU negative Renal ROS  Genitourinary negative   Musculoskeletal   Abdominal   Peds  Hematology negative hematology ROS (+)   Anesthesia Other Findings See surgeon's H&P   Reproductive/Obstetrics negative OB ROS                           Anesthesia Physical Anesthesia Plan  ASA: III  Anesthesia Plan: MAC and Bier Block   Post-op Pain Management:    Induction:   Airway Management Planned: Simple Face Mask  Additional Equipment:   Intra-op Plan:   Post-operative Plan:   Informed Consent: I have reviewed the patients History and Physical, chart, labs and discussed the procedure including the risks, benefits and alternatives for the proposed anesthesia with the patient or authorized representative who has indicated his/her understanding and acceptance.     Plan Discussed with: CRNA and Surgeon  Anesthesia Plan Comments:         Anesthesia Quick Evaluation

## 2011-05-10 NOTE — Anesthesia Procedure Notes (Signed)
Procedure Name: MAC Date/Time: 05/10/2011 10:31 AM Performed by: Daanish Copes D Pre-anesthesia Checklist: Patient identified, Emergency Drugs available, Suction available, Patient being monitored and Timeout performed Patient Re-evaluated:Patient Re-evaluated prior to inductionOxygen Delivery Method: Simple face mask

## 2011-05-10 NOTE — Anesthesia Postprocedure Evaluation (Signed)
Anesthesia Post Note  Patient: Robert Preston  Procedure(s) Performed: Procedure(s) (LRB): CARPAL TUNNEL RELEASE (Right)  Anesthesia type: MAC  Patient location: PACU  Post pain: Pain level controlled  Post assessment: Patient's Cardiovascular Status Stable  Last Vitals:  Filed Vitals:   05/10/11 1130  BP: 113/43  Pulse: 59  Temp:   Resp: 11    Post vital signs: Reviewed and stable  Level of consciousness: alert  Complications: No apparent anesthesia complications

## 2011-05-11 NOTE — Addendum Note (Signed)
Addendum  created 05/11/11 0912 by Lance Coon, CRNA   Modules edited:Anesthesia Responsible Staff

## 2011-05-11 NOTE — Addendum Note (Signed)
Addendum  created 05/11/11 0913 by Lance Coon, CRNA   Modules edited:Anesthesia Responsible Staff

## 2011-05-12 ENCOUNTER — Encounter (HOSPITAL_BASED_OUTPATIENT_CLINIC_OR_DEPARTMENT_OTHER): Payer: Self-pay | Admitting: Orthopedic Surgery

## 2011-09-03 ENCOUNTER — Encounter (HOSPITAL_COMMUNITY): Payer: Self-pay

## 2011-09-03 ENCOUNTER — Emergency Department (HOSPITAL_COMMUNITY): Payer: Medicare Other

## 2011-09-03 ENCOUNTER — Observation Stay (HOSPITAL_COMMUNITY)
Admission: EM | Admit: 2011-09-03 | Discharge: 2011-09-07 | Disposition: A | Discharge status: Skilled Nursing Facility | Payer: Medicare Other | Attending: Family Medicine | Admitting: Family Medicine

## 2011-09-03 DIAGNOSIS — I10 Essential (primary) hypertension: Secondary | ICD-10-CM | POA: Diagnosis present

## 2011-09-03 DIAGNOSIS — S72309A Unspecified fracture of shaft of unspecified femur, initial encounter for closed fracture: Principal | ICD-10-CM | POA: Diagnosis present

## 2011-09-03 DIAGNOSIS — S72009A Fracture of unspecified part of neck of unspecified femur, initial encounter for closed fracture: Secondary | ICD-10-CM

## 2011-09-03 DIAGNOSIS — M899 Disorder of bone, unspecified: Secondary | ICD-10-CM | POA: Insufficient documentation

## 2011-09-03 DIAGNOSIS — F0390 Unspecified dementia without behavioral disturbance: Secondary | ICD-10-CM | POA: Diagnosis present

## 2011-09-03 DIAGNOSIS — N400 Enlarged prostate without lower urinary tract symptoms: Secondary | ICD-10-CM

## 2011-09-03 DIAGNOSIS — W19XXXA Unspecified fall, initial encounter: Secondary | ICD-10-CM | POA: Insufficient documentation

## 2011-09-03 DIAGNOSIS — M949 Disorder of cartilage, unspecified: Secondary | ICD-10-CM | POA: Insufficient documentation

## 2011-09-03 DIAGNOSIS — F05 Delirium due to known physiological condition: Secondary | ICD-10-CM

## 2011-09-03 DIAGNOSIS — Z79899 Other long term (current) drug therapy: Secondary | ICD-10-CM | POA: Insufficient documentation

## 2011-09-03 DIAGNOSIS — M978XXA Periprosthetic fracture around other internal prosthetic joint, initial encounter: Secondary | ICD-10-CM

## 2011-09-03 LAB — URINALYSIS, ROUTINE W REFLEX MICROSCOPIC
Leukocytes, UA: NEGATIVE
Nitrite: NEGATIVE
Specific Gravity, Urine: 1.013 (ref 1.005–1.030)
pH: 7.5 (ref 5.0–8.0)

## 2011-09-03 LAB — BASIC METABOLIC PANEL
BUN: 30 mg/dL — ABNORMAL HIGH (ref 6–23)
Chloride: 101 mEq/L (ref 96–112)
GFR calc Af Amer: 68 mL/min — ABNORMAL LOW (ref >90)
Potassium: 3.9 mEq/L (ref 3.5–5.1)

## 2011-09-03 LAB — DIFFERENTIAL
Basophils Absolute: 0 10*3/uL (ref 0.0–0.1)
Basophils Relative: 0 % (ref 0–1)
Lymphocytes Relative: 16 % (ref 12–46)
Monocytes Relative: 10 % (ref 3–12)
Neutro Abs: 5.8 10*3/uL (ref 1.7–7.7)
Neutrophils Relative %: 72 % (ref 43–77)

## 2011-09-03 LAB — CBC
Hemoglobin: 11.7 g/dL — ABNORMAL LOW (ref 13.0–17.0)
MCHC: 33.8 g/dL (ref 30.0–36.0)
WBC: 8.2 10*3/uL (ref 4.0–10.5)

## 2011-09-03 MED ORDER — ASPIRIN EC 81 MG PO TBEC
81.0000 mg | DELAYED_RELEASE_TABLET | Freq: Every day | ORAL | Status: DC
  Administered 2011-09-03 – 2011-09-07 (×5): 81 mg via ORAL
  Filled 2011-09-03 (×5): qty 1

## 2011-09-03 MED ORDER — ONDANSETRON HCL 4 MG/2ML IJ SOLN: 4.0000 mg | Freq: Three times a day (TID) | INTRAMUSCULAR | Status: DC | PRN

## 2011-09-03 MED ORDER — LORAZEPAM 0.5 MG PO TABS
0.5000 mg | ORAL_TABLET | Freq: Three times a day (TID) | ORAL | Status: DC | PRN
  Administered 2011-09-06: 0.5 mg via ORAL
  Filled 2011-09-03: qty 1

## 2011-09-03 MED ORDER — TERAZOSIN HCL 2 MG PO CAPS
2.0000 mg | ORAL_CAPSULE | Freq: Every day | ORAL | Status: DC
  Administered 2011-09-03 – 2011-09-06 (×4): 2 mg via ORAL
  Filled 2011-09-03 (×5): qty 1

## 2011-09-03 MED ORDER — LEVOTHYROXINE SODIUM 50 MCG PO TABS
50.0000 ug | ORAL_TABLET | Freq: Every day | ORAL | Status: DC
  Administered 2011-09-04 – 2011-09-07 (×4): 50 ug via ORAL
  Filled 2011-09-03 (×5): qty 1

## 2011-09-03 MED ORDER — FINASTERIDE 5 MG PO TABS
5.0000 mg | ORAL_TABLET | Freq: Every day | ORAL | Status: DC
  Administered 2011-09-04 – 2011-09-07 (×4): 5 mg via ORAL
  Filled 2011-09-03 (×4): qty 1

## 2011-09-03 MED ORDER — ACETAMINOPHEN 325 MG PO TABS: 650.0000 mg | ORAL_TABLET | Freq: Four times a day (QID) | ORAL | Status: DC | PRN

## 2011-09-03 MED ORDER — MORPHINE SULFATE 2 MG/ML IJ SOLN
2.0000 mg | Freq: Once | INTRAMUSCULAR | Status: AC
  Administered 2011-09-03: 2 mg via INTRAVENOUS
  Filled 2011-09-03: qty 1

## 2011-09-03 MED ORDER — HYDROCODONE-ACETAMINOPHEN 5-325 MG PO TABS
1.0000 | ORAL_TABLET | ORAL | Status: DC | PRN
  Administered 2011-09-07: 1 via ORAL
  Filled 2011-09-03: qty 1

## 2011-09-03 MED ORDER — METOPROLOL TARTRATE 12.5 MG HALF TABLET
12.5000 mg | ORAL_TABLET | Freq: Two times a day (BID) | ORAL | Status: DC
  Administered 2011-09-03 – 2011-09-05 (×4): 12.5 mg via ORAL
  Filled 2011-09-03 (×5): qty 1

## 2011-09-03 MED ORDER — MEMANTINE HCL 10 MG PO TABS
10.0000 mg | ORAL_TABLET | Freq: Every day | ORAL | Status: DC
  Administered 2011-09-04 – 2011-09-07 (×4): 10 mg via ORAL
  Filled 2011-09-03 (×4): qty 1

## 2011-09-03 MED ORDER — TAMSULOSIN HCL 0.4 MG PO CAPS
0.4000 mg | ORAL_CAPSULE | Freq: Every day | ORAL | Status: DC
  Administered 2011-09-04 – 2011-09-07 (×4): 0.4 mg via ORAL
  Filled 2011-09-03 (×4): qty 1

## 2011-09-03 MED ORDER — SENNA 8.6 MG PO TABS
1.0000 | ORAL_TABLET | Freq: Two times a day (BID) | ORAL | Status: DC
  Administered 2011-09-03 – 2011-09-07 (×8): 8.6 mg via ORAL
  Filled 2011-09-03 (×8): qty 1

## 2011-09-03 MED ORDER — SERTRALINE HCL 50 MG PO TABS
50.0000 mg | ORAL_TABLET | Freq: Every day | ORAL | Status: DC
  Administered 2011-09-04 – 2011-09-07 (×4): 50 mg via ORAL
  Filled 2011-09-03 (×4): qty 1

## 2011-09-03 MED ORDER — ACETAMINOPHEN 650 MG RE SUPP: 650.0000 mg | Freq: Four times a day (QID) | RECTAL | Status: DC | PRN

## 2011-09-03 MED ORDER — PANTOPRAZOLE SODIUM 40 MG PO TBEC
40.0000 mg | DELAYED_RELEASE_TABLET | Freq: Every day | ORAL | Status: DC
  Administered 2011-09-03 – 2011-09-07 (×5): 40 mg via ORAL
  Filled 2011-09-03 (×5): qty 1

## 2011-09-03 MED ORDER — MORPHINE SULFATE 4 MG/ML IJ SOLN: 4.0000 mg | INTRAMUSCULAR | Status: DC | PRN

## 2011-09-03 MED ORDER — DONEPEZIL HCL 10 MG PO TABS
10.0000 mg | ORAL_TABLET | Freq: Every day | ORAL | Status: DC
  Administered 2011-09-03 – 2011-09-06 (×4): 10 mg via ORAL
  Filled 2011-09-03 (×5): qty 1

## 2011-09-03 MED ORDER — VITAMIN D 50 MCG (2000 UT) PO TABS: 2000.0000 [IU] | ORAL_TABLET | Freq: Every day | ORAL | Status: DC

## 2011-09-03 MED ORDER — SODIUM CHLORIDE 0.9 % IV SOLN
INTRAVENOUS | Status: AC
  Administered 2011-09-03: 22:00:00 via INTRAVENOUS

## 2011-09-03 MED ORDER — LORATADINE 10 MG PO TABS
10.0000 mg | ORAL_TABLET | Freq: Every day | ORAL | Status: DC
  Administered 2011-09-04 – 2011-09-07 (×4): 10 mg via ORAL
  Filled 2011-09-03 (×4): qty 1

## 2011-09-03 MED ORDER — ONDANSETRON HCL 4 MG/2ML IJ SOLN: 4.0000 mg | Freq: Four times a day (QID) | INTRAMUSCULAR | Status: AC | PRN

## 2011-09-03 MED ORDER — VITAMIN D3 25 MCG (1000 UNIT) PO TABS
2000.0000 [IU] | ORAL_TABLET | Freq: Every day | ORAL | Status: DC
  Administered 2011-09-03 – 2011-09-07 (×5): 2000 [IU] via ORAL
  Filled 2011-09-03 (×5): qty 2

## 2011-09-03 NOTE — Plan of Care (Signed)
Problem: Phase I Progression Outcomes Goal: Pre op Medical MD consult, if indicated Outcome: Completed/Met Date Met:  09/03/11 Med MD is MD of Record Goal: Pre op NPO per MD orders Outcome: Not Met (add Reason) Surgery not yet scheduled

## 2011-09-03 NOTE — Consult Note (Signed)
Reason for Consult:Fall with right hip pain Referring Physician: EDP  HPI: Robert Preston is an 78 y.o. male s/p bilateral hip replacements by Dr. Lequita Halt, fell at home earlier today with c/o right hip pain and inability to bear weight. Denies pain in either hip prior to todays fall.  Past Medical History  Diagnosis Date  . Hypertension   . Hypercholesterolemia   . DEMENTIA   . Arthritis   . Hypothyroidism   . Neuromuscular disorder     carpal tunnel    Past Surgical History  Procedure Date  . Joint replacement   . Eye surgery     lasik both eyes  . Transurethral resection of prostate [id number]  . Shoulder arthroscopy 2005    right  . Shoulder arthroscopy 2002    rt  . Total hip arthroplasty     right and left  . Cholecystectomy   . Colonoscopy   . Carpal tunnel release 05/10/2011    Procedure: CARPAL TUNNEL RELEASE;  Surgeon: Nicki Reaper, MD;  Location: [facility];  Service: Orthopedics;  Laterality: Right;    History reviewed. No pertinent family history.  Social History:  reports that he has never smoked. He does not have any smokeless tobacco history on file. He reports that he drinks alcohol. He reports that he does not use illicit drugs.  Allergies:  Allergies  Allergen Reactions  . Penicillins Hives    Medications: I have reviewed the patient's current medications.  Results for orders placed during the hospital encounter of 09/03/11 (from the past 48 hour(s))  CBC     Status: Abnormal   Collection Time   09/03/11  8:50 AM      Component Value Range Comment   WBC 8.2  4.0 - 10.5 K/uL    RBC 3.56 (*) 4.22 - 5.81 MIL/uL    Hemoglobin 11.7 (*) 13.0 - 17.0 g/dL    HCT 16.1 (*) 09.6 - 52.0 %    MCV 97.2  78.0 - 100.0 fL    MCH 32.9  26.0 - 34.0 pg    MCHC 33.8  30.0 - 36.0 g/dL    RDW 04.5  40.9 - 81.1 %    Platelets 236  150 - 400 K/uL   DIFFERENTIAL     Status: Normal   Collection Time   09/03/11  8:50 AM      Component Value Range  Comment   Neutrophils Relative 72  43 - 77 %    Neutro Abs 5.8  1.7 - 7.7 K/uL    Lymphocytes Relative 16  12 - 46 %    Lymphs Abs 1.3  0.7 - 4.0 K/uL    Monocytes Relative 10  3 - 12 %    Monocytes Absolute 0.9  0.1 - 1.0 K/uL    Eosinophils Relative 2  0 - 5 %    Eosinophils Absolute 0.2  0.0 - 0.7 K/uL    Basophils Relative 0  0 - 1 %    Basophils Absolute 0.0  0.0 - 0.1 K/uL   BASIC METABOLIC PANEL     Status: Abnormal   Collection Time   09/03/11  8:50 AM      Component Value Range Comment   Sodium 138  135 - 145 mEq/L    Potassium 3.9  3.5 - 5.1 mEq/L    Chloride 101  96 - 112 mEq/L    CO2 27  19 - 32 mEq/L    Glucose, Bld 95  70 - 99 mg/dL    BUN 30 (*) 6 - 23 mg/dL    Creatinine, Ser 1.61  0.50 - 1.35 mg/dL    Calcium 9.7  8.4 - 09.6 mg/dL    GFR calc non Af Amer 59 (*) >90 mL/min    GFR calc Af Amer 68 (*) >90 mL/min   URINALYSIS, ROUTINE W REFLEX MICROSCOPIC     Status: Normal   Collection Time   09/03/11 10:39 AM      Component Value Range Comment   Color, Urine YELLOW  YELLOW    APPearance CLEAR  CLEAR    Specific Gravity, Urine 1.013  1.005 - 1.030    pH 7.5  5.0 - 8.0    Glucose, UA NEGATIVE  NEGATIVE mg/dL    Hgb urine dipstick NEGATIVE  NEGATIVE    Bilirubin Urine NEGATIVE  NEGATIVE    Ketones, ur NEGATIVE  NEGATIVE mg/dL    Protein, ur NEGATIVE  NEGATIVE mg/dL    Urobilinogen, UA 0.2  0.0 - 1.0 mg/dL    Nitrite NEGATIVE  NEGATIVE    Leukocytes, UA NEGATIVE  NEGATIVE MICROSCOPIC NOT DONE ON URINES WITH NEGATIVE PROTEIN, BLOOD, LEUKOCYTES, NITRITE, OR GLUCOSE <1000 mg/dL.    Dg Chest 2 View  09/03/2011  *RADIOLOGY REPORT*  Clinical Data: Fall  CHEST - 2 VIEW  Comparison: None.  Findings: Normal heart size.  Clear lungs.  No pneumothorax.  No pleural effusion. Hyperaeration.  IMPRESSION: No active cardiopulmonary disease.  Original Report Authenticated By: Donavan Burnet, M.D.   Dg Hip Complete Right  09/03/2011  *RADIOLOGY REPORT*  Clinical Data: Fall   RIGHT HIP - COMPLETE 2+ VIEW  Comparison: 02/15/2011  Findings: Bilateral total hip arthroplasties are noted.  The position of the femoral components with respect to the acetabular components is stable.  There is an acute fracture involving the proximal right femur, just below the greater trochanter.  The distal femur is intact.  There is lucency surrounding the femoral component of the right total hip arthroplasty superiorly  which is stable.  Osteopenia.  Otherwise no evidence of acute fracture.  IMPRESSION: Acute proximal right femur fracture just below the greater trochanter.  Total hip arthroplasty is otherwise stable in appearance.  Lucency surrounds the proximal femoral prosthetic component.  Original Report Authenticated By: Donavan Burnet, M.D.   Dg Knee Complete 4 Views Right  09/03/2011  *RADIOLOGY REPORT*  Clinical Data: Fall  RIGHT KNEE - COMPLETE 4+ VIEW  Comparison: None.  Findings: No acute fracture and no dislocation.  Mild degenerative changes.  IMPRESSION: No acute bony pathology.  Original Report Authenticated By: Donavan Burnet, M.D.      Physical Exam: Thin WM, cooperative, oriented, able to provide details of todays events. No pain with motion of upper extremities. Pelvis stable, non tender about LLE. RLE with pain on attempts at hip motion. Tender to palpation about right hip. Grossly N/V intact distally in RLE Xray shows acute right periprosthetic proximal femur fracture, chronic resorption of proximal femoral bone secondary to stress shielding, apparent poly wear of acetabulum with mild proximal migration of femoral head. Vitals Temp:  [97.8 F (36.6 C)] 97.8 F (36.6 C) (06/23 0826) Pulse Rate:  [60] 60  (06/23 0826) Resp:  [15] 15  (06/23 0826) BP: (142)/(66) 142/66 mmHg (06/23 0826) SpO2:  [97 %-100 %] 97 % (06/23 0827) FiO2 (%):  [93 %] 93 % (06/23 1109)  Assessment/Plan: Impression: Right periprosthetic proximal femur fracture Evidence for poly wear acetabulum.  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 Treatment: Admit for pain control, bed rest. Will contact Dr. Lequita Halt for further treatment recommendations.  Sofya Moustafa M 09/03/2011, 11:31 AM

## 2011-09-03 NOTE — ED Notes (Addendum)
In by ems from home, states while only wearing socks slipped and fell in the kitchen, c/o right hip/knee pain per ems no deformities noted denies head injury/loc hx dementia a/ox3

## 2011-09-03 NOTE — ED Provider Notes (Signed)
History     CSN: [id number]  Arrival date & time 09/03/11  4098   First MD Initiated Contact with Patient 09/03/11 [phone]      Chief Complaint  Patient presents with  . Fall  . Hip Pain  . Knee Pain    (Consider location/radiation/quality/duration/timing/severity/associated sxs/prior treatment) HPI Pt had slip and fall from standing at home. States he was in a hurry and slipped landing on R hip and knee. Denies LOC, head or neck trauma. Pt denies recent illness, no fever, chills, CP, SOB.  Past Medical History  Diagnosis Date  . Hypertension   . Hypercholesterolemia   . DEMENTIA   . Arthritis   . Hypothyroidism   . Neuromuscular disorder     carpal tunnel    Past Surgical History  Procedure Date  . Joint replacement   . Eye surgery     lasik both eyes  . Transurethral resection of prostate [id number]  . Shoulder arthroscopy 2005    right  . Shoulder arthroscopy 2002    rt  . Total hip arthroplasty     right and left  . Cholecystectomy   . Colonoscopy   . Carpal tunnel release 05/10/2011    Procedure: CARPAL TUNNEL RELEASE;  Surgeon: Nicki Reaper, MD;  Location: [facility];  Service: Orthopedics;  Laterality: Right;    History reviewed. No pertinent family history.  History  Substance Use Topics  . Smoking status: Never Smoker   . Smokeless tobacco: Not on file  . Alcohol Use: Yes     occasionally      Review of Systems  Constitutional: Negative for fever and chills.  HENT: Negative for neck pain.   Respiratory: Negative for cough and shortness of breath.   Cardiovascular: Negative for chest pain and leg swelling.  Gastrointestinal: Negative for nausea, vomiting and abdominal pain.  Skin: Negative for rash and wound.  Neurological: Negative for dizziness, weakness, light-headedness, numbness and headaches.    Allergies  Penicillins  Home Medications   Current Outpatient Rx  Name Route Sig Dispense Refill  . ASPIRIN EC 81 MG PO  TBEC Oral Take 81 mg by mouth daily.    Marland Kitchen VITAMIN D 2000 UNITS PO TABS Oral Take 2,000 Units by mouth daily.    . DONEPEZIL HCL 10 MG PO TABS Oral Take 10 mg by mouth at bedtime.     Marland Kitchen FINASTERIDE 5 MG PO TABS Oral Take 5 mg by mouth daily.      Marland Kitchen FLAXSEED OIL PO Oral Take 1 capsule by mouth daily.     Marland Kitchen LEVOTHYROXINE SODIUM 50 MCG PO TABS Oral Take 50 mcg by mouth daily.    Marland Kitchen LORATADINE 10 MG PO TABS Oral Take 10 mg by mouth daily.      Marland Kitchen LORAZEPAM 0.5 MG PO TABS Oral Take 0.5 mg by mouth every 8 (eight) hours as needed. For anxiety.    Marland Kitchen MEMANTINE HCL 10 MG PO TABS Oral Take 10 mg by mouth daily.    . SERTRALINE HCL 50 MG PO TABS Oral Take 50 mg by mouth daily.    Marland Kitchen TAMSULOSIN HCL 0.4 MG PO CAPS Oral Take 0.4 mg by mouth daily.    Marland Kitchen TERAZOSIN HCL 2 MG PO CAPS Oral Take 2 mg by mouth at bedtime.      BP 142/66  Pulse 60  Temp 97.8 F (36.6 C) (Oral)  Resp 15  SpO2 97%  Physical Exam  Nursing note and vitals  reviewed. Constitutional: He is oriented to person, place, and time. He appears well-developed and well-nourished. No distress.  HENT:  Head: Normocephalic and atraumatic.  Mouth/Throat: Oropharynx is clear and moist.  Eyes: EOM are normal. Pupils are equal, round, and reactive to light.  Neck: Normal range of motion. Neck supple.       No posterior cervical TTP  Cardiovascular: Normal rate and regular rhythm.   Pulmonary/Chest: Effort normal and breath sounds normal. No respiratory distress. He has no wheezes. He has no rales.  Abdominal: Soft. Bowel sounds are normal. There is no tenderness. There is no rebound and no guarding.  Musculoskeletal: Normal range of motion. He exhibits tenderness (TTP over lateral R hip. No deformity or shortening of the extremity. neurovasc intact. Knee with FROM and minimal TTP over patella. No laxitiy ). He exhibits no edema.  Neurological: He is alert and oriented to person, place, and time.       Mild confusion. 5/5 motor, sensation intact    Skin: Skin is warm and dry. No rash noted. No erythema.  Psychiatric: He has a normal mood and affect. His behavior is normal.    ED Course  Procedures (including critical care time)  Labs Reviewed  CBC - Abnormal; Notable for the following:    RBC 3.56 (*)     Hemoglobin 11.7 (*)     HCT 34.6 (*)     All other components within normal limits  BASIC METABOLIC PANEL - Abnormal; Notable for the following:    BUN 30 (*)     GFR calc non Af Amer 59 (*)     GFR calc Af Amer 68 (*)     All other components within normal limits  DIFFERENTIAL  URINALYSIS, ROUTINE W REFLEX MICROSCOPIC   Dg Chest 2 View  09/03/2011  *RADIOLOGY REPORT*  Clinical Data: Fall  CHEST - 2 VIEW  Comparison: None.  Findings: Normal heart size.  Clear lungs.  No pneumothorax.  No pleural effusion. Hyperaeration.  IMPRESSION: No active cardiopulmonary disease.  Original Report Authenticated By: Donavan Burnet, M.D.   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  The position of the femoral components with respect to the acetabular components is stable.  There is an acute fracture involving the proximal right femur, just below the greater trochanter.  The distal femur is intact.  There is lucency surrounding the femoral component of the right total hip arthroplasty superiorly  which is stable.  Osteopenia.  Otherwise no evidence of acute fracture.  IMPRESSION: Acute proximal right femur fracture just below the greater trochanter.  Total hip arthroplasty is otherwise stable in appearance.  Lucency surrounds the proximal femoral prosthetic component.  Original Report Authenticated By: Donavan Burnet, M.D.   Dg Knee Complete 4 Views Right  09/03/2011  *RADIOLOGY REPORT*  Clinical Data: Fall  RIGHT KNEE - COMPLETE 4+ VIEW  Comparison: None.  Findings: No acute fracture and no dislocation.   Mild degenerative changes.  IMPRESSION: No acute bony pathology.  Original Report Authenticated By: Donavan Burnet, M.D.     1. Periprosthetic fracture of hip      Date: 09/03/2011  Rate: 61  Rhythm: normal sinus rhythm  QRS Axis: normal  Intervals: normal  ST/T Wave abnormalities: normal  Conduction Disutrbances:none  Narrative Interpretation:   Old EKG Reviewed: unchanged    MDM  Seen Dr Rennis Chris in ED. Asked to have [facility] admit.         Loren Racer, MD 09/03/11 1241

## 2011-09-03 NOTE — ED Notes (Signed)
ZOX:[id number]<AV> Expected date:09/03/11<BR> Expected time: 8:02 AM<BR> Means of arrival:Ambulance<BR> Comments:<BR> Fall, no injuries, wants eval

## 2011-09-03 NOTE — H&P (Signed)
PCP:   Nadean Corwin, MD   Chief Complaint:  Fall, right thigh pain today. HPI: Robert Preston was busy dressing himself to head out with his wife this morning when he fell, jarring his right leg on the floor. He was found to have a periprosthetic fracture of the right femur. Dr Supple(orthopedics) has been consulted, and we were asked to admit this pleasant elderly gentleman to the medical service. His wife mentions that he has fallen a number of times in the last few months, but was in relative good health when he fell. No angina or SOBE.  Review of Systems:  Unremarkable except as highlighted in the hpi.  Past Medical History: Past Medical History  Diagnosis Date  . Hypertension   . Hypercholesterolemia   . DEMENTIA   . Arthritis   . Hypothyroidism   . Neuromuscular disorder     carpal tunnel   Past Surgical History  Procedure Date  . Joint replacement   . Eye surgery     lasik both eyes  . Transurethral resection of prostate [id number]  . Shoulder arthroscopy 2005    right  . Shoulder arthroscopy 2002    rt  . Total hip arthroplasty     right and left  . Cholecystectomy   . Colonoscopy   . Carpal tunnel release 05/10/2011    Procedure: CARPAL TUNNEL RELEASE;  Surgeon: Nicki Reaper, MD;  Location: [facility];  Service: Orthopedics;  Laterality: Right;    Medications: Prior to Admission medications   Medication Sig Start Date End Date Taking? Authorizing Provider  aspirin EC 81 MG tablet Take 81 mg by mouth daily.   Yes Historical Provider, MD  Cholecalciferol (VITAMIN D) 2000 UNITS tablet Take 2,000 Units by mouth daily.   Yes Historical Provider, MD  donepezil (ARICEPT) 10 MG tablet Take 10 mg by mouth at bedtime.    Yes Historical Provider, MD  finasteride (PROSCAR) 5 MG tablet Take 5 mg by mouth daily.     Yes Historical Provider, MD  Flaxseed, Linseed, (FLAXSEED OIL PO) Take 1 capsule by mouth daily.    Yes Historical Provider, MD  levothyroxine  (SYNTHROID, LEVOTHROID) 50 MCG tablet Take 50 mcg by mouth daily.   Yes Historical Provider, MD  loratadine (CLARITIN) 10 MG tablet Take 10 mg by mouth daily.     Yes Historical Provider, MD  LORazepam (ATIVAN) 0.5 MG tablet Take 0.5 mg by mouth every 8 (eight) hours as needed. For anxiety.   Yes Historical Provider, MD  memantine (NAMENDA) 10 MG tablet Take 10 mg by mouth daily.   Yes Historical Provider, MD  sertraline (ZOLOFT) 50 MG tablet Take 50 mg by mouth daily.   Yes Historical Provider, MD  Tamsulosin HCl (FLOMAX) 0.4 MG CAPS Take 0.4 mg by mouth daily.   Yes Historical Provider, MD  terazosin (HYTRIN) 2 MG capsule Take 2 mg by mouth at bedtime.   Yes Historical Provider, MD    Allergies:   Allergies  Allergen Reactions  . Penicillins Hives    Social History:  reports that he has never smoked. He has never used smokeless tobacco. He reports that he does not drink alcohol or use illicit drugs. lives with wife.  Family History: History reviewed. No pertinent family history.  Physical Exam: Filed Vitals:   09/03/11 0826 09/03/11 0827 09/03/11 1302 09/03/11 1450  BP: 142/66  124/66 143/66  Pulse: 60  82 61  Temp: 97.8 F (36.6 C)   99 F (  37.2 C)  TempSrc: Oral   Oral  Resp: 15  14 20   Height:    5\' 10"  (1.778 m)  Weight:    82.555 kg (182 lb)  SpO2: 100% 97% 99% 98%   Lying comfortably in bed. PERRLA. No jvd. No carotid bruits. Lungs clear. S1S2 heard. No murmurs. RRR. Abdomen- soft, non tender. +BS. CNS- grossly Intact. Extremities- no pedal edema. Peripheral pulses equal.   Labs on Admission:   [facility] 09/03/11 0850  NA 138  K 3.9  CL 101  CO2 27  GLUCOSE 95  BUN 30*  CREATININE 1.09  CALCIUM 9.7  MG --  PHOS --   No results found for this basename: AST:2,ALT:2,ALKPHOS:2,BILITOT:2,PROT:2,ALBUMIN:2 in the last 72 hours No results found for this basename: LIPASE:2,AMYLASE:2 in the last 72 hours  Basename 09/03/11 0850  WBC 8.2  NEUTROABS 5.8  HGB  11.7*  HCT 34.6*  MCV 97.2  PLT 236   No results found for this basename: CKTOTAL:3,CKMB:3,CKMBINDEX:3,TROPONINI:3 in the last 72 hours No results found for this basename: TSH,T4TOTAL,FREET3,T3FREE,THYROIDAB in the last 72 hours No results found for this basename: VITAMINB12:2,FOLATE:2,FERRITIN:2,TIBC:2,IRON:2,RETICCTPCT:2 in the last 72 hours  Radiological Exams on Admission: Dg Chest 2 View  09/03/2011  *RADIOLOGY REPORT*  Clinical Data: Fall  CHEST - 2 VIEW  Comparison: None.  Findings: Normal heart size.  Clear lungs.  No pneumothorax.  No pleural effusion. Hyperaeration.  IMPRESSION: No active cardiopulmonary disease.  Original Report Authenticated By: Donavan Burnet, M.D.   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  The position of the femoral components with respect to the acetabular components is stable.  There is an acute fracture involving the proximal right femur, just below the greater trochanter.  The distal femur is intact.  There is lucency surrounding the femoral component of the right total hip arthroplasty superiorly  which is stable.  Osteopenia.  Otherwise no evidence of acute fracture.  IMPRESSION: Acute proximal right femur fracture just below the greater trochanter.  Total hip arthroplasty is otherwise stable in appearance.  Lucency surrounds the proximal femoral prosthetic component.  Original Report Authenticated By: Donavan Burnet, M.D.   Dg Knee Complete 4 Views Right  09/03/2011  *RADIOLOGY REPORT*  Clinical Data: Fall  RIGHT KNEE - COMPLETE 4+ VIEW  Comparison: None.  Findings: No acute fracture and no dislocation.  Mild degenerative changes.  IMPRESSION: No acute bony pathology.  Original Report Authenticated By: Donavan Burnet, M.D.    Assessment  Acute right femur periprosthetic fracture in elderly male, who has had  recurrent falls in recent months. This appears mechanical in origin . He is a medium risk candidate for an emergent average risk procedure.  Plan  .Fracture closed, femur, shaft- appreciate ortho. Admit med-surg. Defer mx to ortho. May need str. Marland KitchenHTN (hypertension), benign- betat blocker perioperatively. .Dementia- stable. Continue home meds. Marland KitchenBPH (benign prostatic hyperplasia)- stable. Resume home meds. Dvt/gi prophylaxis.   Robert Preston [id number] 09/03/2011, 3:50 PM

## 2011-09-04 ENCOUNTER — Inpatient Hospital Stay (HOSPITAL_COMMUNITY): Payer: Medicare Other

## 2011-09-04 DIAGNOSIS — F0390 Unspecified dementia without behavioral disturbance: Secondary | ICD-10-CM

## 2011-09-04 DIAGNOSIS — N400 Enlarged prostate without lower urinary tract symptoms: Secondary | ICD-10-CM

## 2011-09-04 DIAGNOSIS — S72009A Fracture of unspecified part of neck of unspecified femur, initial encounter for closed fracture: Secondary | ICD-10-CM

## 2011-09-04 DIAGNOSIS — F05 Delirium due to known physiological condition: Secondary | ICD-10-CM

## 2011-09-04 DIAGNOSIS — I10 Essential (primary) hypertension: Secondary | ICD-10-CM

## 2011-09-04 LAB — COMPREHENSIVE METABOLIC PANEL
ALT: 15 U/L (ref 0–53)
Albumin: 3.4 g/dL — ABNORMAL LOW (ref 3.5–5.2)
Alkaline Phosphatase: 75 U/L (ref 39–117)
BUN: 25 mg/dL — ABNORMAL HIGH (ref 6–23)
Chloride: 104 mEq/L (ref 96–112)
GFR calc Af Amer: 66 mL/min — ABNORMAL LOW (ref >90)
Glucose, Bld: 111 mg/dL — ABNORMAL HIGH (ref 70–99)
Potassium: 4.1 mEq/L (ref 3.5–5.1)
Sodium: 138 mEq/L (ref 135–145)
Total Bilirubin: 0.5 mg/dL (ref 0.3–1.2)
Total Protein: 6.3 g/dL (ref 6.0–8.3)

## 2011-09-04 LAB — CBC
HCT: 32 % — ABNORMAL LOW (ref 39.0–52.0)
MCHC: 33.8 g/dL (ref 30.0–36.0)
MCV: 98.2 fL (ref 78.0–100.0)
Platelets: 219 10*3/uL (ref 150–400)
RDW: 12.7 % (ref 11.5–15.5)

## 2011-09-04 NOTE — Progress Notes (Signed)
SUBJECTIVE "Had a rough night: due to pain. Can't move right shoulder.   1. Periprosthetic fracture of hip     Past Medical History  Diagnosis Date  . Hypertension   . Hypercholesterolemia   . DEMENTIA   . Arthritis   . Hypothyroidism   . Neuromuscular disorder     carpal tunnel   Current Facility-Administered Medications  Medication Dose Route Frequency Provider Last Rate Last Dose  . 0.9 %  sodium chloride infusion   Intravenous Continuous Parvin Stetzer, MD 50 mL/hr at 09/03/11 2211    . acetaminophen (TYLENOL) tablet 650 mg  650 mg Oral Q6H PRN Patriece Archbold, MD       Or  . acetaminophen (TYLENOL) suppository 650 mg  650 mg Rectal Q6H PRN Daryel Kenneth, MD      . aspirin EC tablet 81 mg  81 mg Oral Daily Jonathon Tan, MD   81 mg at 09/04/11 1013  . cholecalciferol (VITAMIN D) tablet 2,000 Units  2,000 Units Oral Daily Renlee Floor, MD   2,000 Units at 09/04/11 1013  . donepezil (ARICEPT) tablet 10 mg  10 mg Oral QHS Jodie Cavey, MD   10 mg at 09/03/11 2132  . finasteride (PROSCAR) tablet 5 mg  5 mg Oral Daily Timofey Carandang, MD   5 mg at 09/04/11 1013  . HYDROcodone-acetaminophen (NORCO) 5-325 MG per tablet 1-2 tablet  1-2 tablet Oral Q4H PRN Earnesteen Birnie, MD      . levothyroxine (SYNTHROID, LEVOTHROID) tablet 50 mcg  50 mcg Oral Q breakfast Chandy Tarman, MD   50 mcg at 09/04/11 1610  . loratadine (CLARITIN) tablet 10 mg  10 mg Oral Daily Glendal Cassaday, MD   10 mg at 09/04/11 1014  . LORazepam (ATIVAN) tablet 0.5 mg  0.5 mg Oral Q8H PRN Rudolph Daoust, MD      . memantine (NAMENDA) tablet 10 mg  10 mg Oral Daily Jawuan Robb, MD   10 mg at 09/04/11 1013  . metoprolol tartrate (LOPRESSOR) tablet 12.5 mg  12.5 mg Oral BID Reubin Bushnell, MD   12.5 mg at 09/04/11 1013  . morphine 4 MG/ML injection 4 mg  4 mg Intravenous Q2H PRN Loren Racer, MD      . ondansetron [facility]) injection 4 mg  4 mg Intravenous Q6H PRN Ulysees Robarts, MD      . pantoprazole (PROTONIX) EC tablet 40  mg  40 mg Oral Q1200 Veronica Fretz, MD   40 mg at 09/03/11 1655  . senna (SENOKOT) tablet 8.6 mg  1 tablet Oral BID Salvatrice Morandi, MD   8.6 mg at 09/04/11 1014  . sertraline (ZOLOFT) tablet 50 mg  50 mg Oral Daily Taisa Deloria, MD   50 mg at 09/04/11 1013  . Tamsulosin HCl (FLOMAX) capsule 0.4 mg  0.4 mg Oral Daily Doriana Mazurkiewicz, MD   0.4 mg at 09/04/11 1013  . terazosin (HYTRIN) capsule 2 mg  2 mg Oral QHS Hesham Womac, MD   2 mg at 09/03/11 2132  . DISCONTD: ondansetron (ZOFRAN) injection 4 mg  4 mg Intravenous Q8H PRN Loren Racer, MD      . DISCONTD: Vitamin D 2,000 Units  2,000 Units Oral Daily Conley Canal, MD       Allergies  Allergen Reactions  . Penicillins Hives   Active Problems:  Fracture closed, femur, shaft  HTN (hypertension), benign  Dementia  BPH (benign prostatic hyperplasia)   Vital signs in last 24 hours: Temp:  [98.5 F (36.9 C)-99  F (37.2 C)] 99 F (37.2 C) (06/24 0525) Pulse Rate:  [61-82] 71  (06/24 0525) Resp:  [14-20] 16  (06/24 0525) BP: (103-143)/(56-66) 112/65 mmHg (06/24 0525) SpO2:  [98 %-99 %] 98 % (06/24 0525) FiO2 (%):  [93 %] 93 % (06/23 1109) Weight:  [82.555 kg (182 lb)] 82.555 kg (182 lb) (06/23 1450) Weight change:  Last BM Date: 09/03/11  Intake/Output from previous day: 06/23 0701 - 06/24 0700 In: 875 [P.O.:480; I.V.:395] Out: 800 [Urine:800] Intake/Output this shift: Total I/O In: 120 [P.O.:120] Out: -   Lab Results:  Basename 09/04/11 0404 09/03/11 0850  WBC 8.5 8.2  HGB 10.8* 11.7*  HCT 32.0* 34.6*  PLT 219 236   BMET  Basename 09/04/11 0404 09/03/11 0850  NA 138 138  K 4.1 3.9  CL 104 101  CO2 28 27  GLUCOSE 111* 95  BUN 25* 30*  CREATININE 1.12 1.09  CALCIUM 9.2 9.7    Studies/Results: Dg Chest 2 View  09/03/2011  *RADIOLOGY REPORT*  Clinical Data: Fall  CHEST - 2 VIEW  Comparison: None.  Findings: Normal heart size.  Clear lungs.  No pneumothorax.  No pleural effusion. Hyperaeration.  IMPRESSION: No  active cardiopulmonary disease.  Original Report Authenticated By: Donavan Burnet, M.D.   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  The position of the femoral components with respect to the acetabular components is stable.  There is an acute fracture involving the proximal right femur, just below the greater trochanter.  The distal femur is intact.  There is lucency surrounding the femoral component of the right total hip arthroplasty superiorly  which is stable.  Osteopenia.  Otherwise no evidence of acute fracture.  IMPRESSION: Acute proximal right femur fracture just below the greater trochanter.  Total hip arthroplasty is otherwise stable in appearance.  Lucency surrounds the proximal femoral prosthetic component.  Original Report Authenticated By: Donavan Burnet, M.D.   Dg Knee Complete 4 Views Right  09/03/2011  *RADIOLOGY REPORT*  Clinical Data: Fall  RIGHT KNEE - COMPLETE 4+ VIEW  Comparison: None.  Findings: No acute fracture and no dislocation.  Mild degenerative changes.  IMPRESSION: No acute bony pathology.  Original Report Authenticated By: Donavan Burnet, M.D.    Medications: I have reviewed the patient's current medications.   Physical exam GENERAL- alert HEAD- normal atraumatic, no neck masses, normal thyroid, no jvd RESPIRATORY- appears well, vitals normal, no respiratory distress, acyanotic, normal RR, ear and throat exam is normal, neck free of mass or lymphadenopathy, chest clear, no wheezing, crepitations, rhonchi, normal symmetric air entry CVS- regular rate and rhythm, S1, S2 normal, no murmur, click, rub or gallop ABDOMEN- abdomen is soft without significant tenderness, masses, organomegaly or guarding NEURO- Grossly normal EXTREMITIES- limited room right shoulder due to pain.  Plan   .Fracture closed, femur, shaft/right  shoulder pain- appreciate ortho. Xray right shoulder. Defer rest of mx to ortho. Marland KitchenHTN (hypertension), benign- Controlled. Continue beta blocker perioperatively. .Dementia- stable. Continue home meds. Marland KitchenBPH (benign prostatic hyperplasia)- stable. Continue home meds.  Dvt/gi prophylaxis.     Fareeda Downard 09/04/2011 11:02 AM Pager: [phone].

## 2011-09-04 NOTE — Progress Notes (Signed)
Robert Preston  MRN: [id number] DOB/Age: [birth year] 77 y.o. Physician: Lynnea Maizes, M.D.      Subjective: C/o right shoulder pain. Wife reports patient was found at home after fall lying on right shoulder. Past hx R shoulder rotator cuff repair by Dr. Lequita Halt with patient and wife reporting some chronic difficulties with shoulder pain and possible recurrent tear. Vital Signs Temp:  [98.5 F (36.9 C)-99 F (37.2 C)] 99 F (37.2 C) (06/24 0525) Pulse Rate:  [61-82] 71  (06/24 0525) Resp:  [14-20] 16  (06/24 0525) BP: (103-143)/(56-66) 112/65 mmHg (06/24 0525) SpO2:  [98 %-99 %] 98 % (06/24 0525) Weight:  [82.555 kg (182 lb)] 82.555 kg (182 lb) (06/23 1450)  Lab Results  Basename 09/04/11 0404 09/03/11 0850  WBC 8.5 8.2  HGB 10.8* 11.7*  HCT 32.0* 34.6*  PLT 219 236   BMET  Basename 09/04/11 0404 09/03/11 0850  NA 138 138  K 4.1 3.9  CL 104 101  CO2 28 27  GLUCOSE 111* 95  BUN 25* 30*  CREATININE 1.12 1.09  CALCIUM 9.2 9.7   INR  Date Value Range Status  09/04/2011 1.18  0.00 - 1.49 Final     Exam  Right shoulder with painful passive motion, 45 degrees active forward elevation. External rotators 4/5. No crepitance with passive motion. Right hip wih continued pain with ROM attempts.  R shoulder wray negative for fracture, anchors from previous cuff repair.  Plan I have spoken to Dr. Lequita Halt who will review xrays and comment on treatment plan for hip. For right shoulder recommend conservative Rx and advance use as tolerated. [name] 09/04/2011, 12:54 PM

## 2011-09-05 DIAGNOSIS — F0390 Unspecified dementia without behavioral disturbance: Secondary | ICD-10-CM

## 2011-09-05 DIAGNOSIS — I10 Essential (primary) hypertension: Secondary | ICD-10-CM

## 2011-09-05 DIAGNOSIS — N400 Enlarged prostate without lower urinary tract symptoms: Secondary | ICD-10-CM

## 2011-09-05 DIAGNOSIS — S72009A Fracture of unspecified part of neck of unspecified femur, initial encounter for closed fracture: Secondary | ICD-10-CM

## 2011-09-05 DIAGNOSIS — F05 Delirium due to known physiological condition: Secondary | ICD-10-CM

## 2011-09-05 LAB — BASIC METABOLIC PANEL
BUN: 25 mg/dL — ABNORMAL HIGH (ref 6–23)
CO2: 27 mEq/L (ref 19–32)
Calcium: 9.1 mg/dL (ref 8.4–10.5)
Creatinine, Ser: 1.06 mg/dL (ref 0.50–1.35)
Glucose, Bld: 109 mg/dL — ABNORMAL HIGH (ref 70–99)

## 2011-09-05 LAB — CBC
HCT: 31.4 % — ABNORMAL LOW (ref 39.0–52.0)
MCH: 32.7 pg (ref 26.0–34.0)
MCV: 97.8 fL (ref 78.0–100.0)
Platelets: 211 10*3/uL (ref 150–400)
RBC: 3.21 MIL/uL — ABNORMAL LOW (ref 4.22–5.81)

## 2011-09-05 NOTE — Progress Notes (Signed)
Robert Preston is a pleasant 78 y.o. male, admitted on 09/03/11, after a fall at home, resulting in a periprosthetic non displaced right intertrochanteric fracture. Ortho has been following and has recommended non surgical management. Appreciate Dr Despina Hick input. Wife would want patient to go to [facility] if possible, for Physical therapy.  SUBJECTIVE Some pain with passive movement of right leg.  1. Periprosthetic fracture of hip     Past Medical History  Diagnosis Date  . Hypertension   . Hypercholesterolemia   . DEMENTIA   . Arthritis   . Hypothyroidism   . Neuromuscular disorder     carpal tunnel   Current Facility-Administered Medications  Medication Dose Route Frequency Provider Last Rate Last Dose  . 0.9 %  sodium chloride infusion   Intravenous Continuous Tilley Faeth, MD 50 mL/hr at 09/03/11 2211    . acetaminophen (TYLENOL) tablet 650 mg  650 mg Oral Q6H PRN Santanna Whitford, MD       Or  . acetaminophen (TYLENOL) suppository 650 mg  650 mg Rectal Q6H PRN Jason Hauge, MD      . aspirin EC tablet 81 mg  81 mg Oral Daily Martese Vanatta, MD   81 mg at 09/05/11 1017  . cholecalciferol (VITAMIN D) tablet 2,000 Units  2,000 Units Oral Daily Tabb Croghan, MD   2,000 Units at 09/05/11 1017  . donepezil (ARICEPT) tablet 10 mg  10 mg Oral QHS Chiamaka Latka, MD   10 mg at 09/04/11 2118  . finasteride (PROSCAR) tablet 5 mg  5 mg Oral Daily [facility] Stfleur, MD   5 mg at 09/05/11 1018  . HYDROcodone-acetaminophen (NORCO) 5-325 MG per tablet 1-2 tablet  1-2 tablet Oral Q4H PRN Sri Clegg, MD      . levothyroxine (SYNTHROID, LEVOTHROID) tablet 50 mcg  50 mcg Oral Q breakfast Shunsuke Granzow, MD   50 mcg at 09/05/11 0752  . loratadine (CLARITIN) tablet 10 mg  10 mg Oral Daily Teagen Bucio, MD   10 mg at 09/05/11 1018  . LORazepam (ATIVAN) tablet 0.5 mg  0.5 mg Oral Q8H PRN Yolonda Purtle, MD      . memantine (NAMENDA) tablet 10 mg  10 mg Oral Daily Shawnta Zimbelman, MD   10 mg at 09/05/11 1017   . morphine 4 MG/ML injection 4 mg  4 mg Intravenous Q2H PRN Loren Racer, MD      . pantoprazole (PROTONIX) EC tablet 40 mg  40 mg Oral Q1200 Senay Sistrunk, MD   40 mg at 09/04/11 1232  . senna (SENOKOT) tablet 8.6 mg  1 tablet Oral BID Lamberto Dinapoli, MD   8.6 mg at 09/05/11 1018  . sertraline (ZOLOFT) tablet 50 mg  50 mg Oral Daily Randi Poullard, MD   50 mg at 09/05/11 1017  . Tamsulosin HCl (FLOMAX) capsule 0.4 mg  0.4 mg Oral Daily Atari Novick, MD   0.4 mg at 09/05/11 1017  . terazosin (HYTRIN) capsule 2 mg  2 mg Oral QHS Ornella Coderre, MD   2 mg at 09/04/11 2118  . DISCONTD: metoprolol tartrate (LOPRESSOR) tablet 12.5 mg  12.5 mg Oral BID Indiyah Paone, MD   12.5 mg at 09/05/11 1017   Allergies  Allergen Reactions  . Penicillins Hives   Active Problems:  Fracture closed, femur, shaft  HTN (hypertension), benign  Dementia  BPH (benign prostatic hyperplasia)   Vital signs in last 24 hours: Temp:  [98.1 F (36.7 C)-98.6 F (37 C)] 98.6 F (37 C) (06/25  0900) Pulse Rate:  [57-98] 98  (06/25 1017) Resp:  [15-16] 15  (06/25 0900) BP: (93-135)/(57-68) 106/62 mmHg (06/25 1017) SpO2:  [95 %-98 %] 95 % (06/25 0900) Weight change:  Last BM Date: 09/03/11  Intake/Output from previous day: 06/24 0701 - 06/25 0700 In: 1278.3 [P.O.:720; I.V.:558.3] Out: 400 [Urine:400] Intake/Output this shift:    Lab Results:  Basename 09/05/11 0420 09/04/11 0404  WBC 9.8 8.5  HGB 10.5* 10.8*  HCT 31.4* 32.0*  PLT 211 219   BMET  Basename 09/05/11 0420 09/04/11 0404  NA 137 138  K 3.9 4.1  CL 101 104  CO2 27 28  GLUCOSE 109* 111*  BUN 25* 25*  CREATININE 1.06 1.12  CALCIUM 9.1 9.2    Studies/Results: Dg Shoulder Right  09/04/2011  *RADIOLOGY REPORT*  Clinical Data: Recent fall.  Possible fracture.  Previous rotator cuff surgery.  RIGHT SHOULDER - 2+ VIEW  Comparison: None.  Findings: There are no fractures or dislocations.  There are mild glenohumeral joint space degenerative  changes.  There are no destructive changes.  IMPRESSION: Mild degenerative changes.  No acute findings.  Original Report Authenticated By: Rolla Plate, M.D.    Medications: I have reviewed the patient's current medications.   Physical exam GENERAL- alert HEAD- normal atraumatic, no neck masses, normal thyroid, no jvd RESPIRATORY- appears well, vitals normal, no respiratory distress, acyanotic, normal RR, ear and throat exam is normal, neck free of mass or lymphadenopathy, chest clear, no wheezing, crepitations, rhonchi, normal symmetric air entry CVS- regular rate and rhythm, S1, S2 normal, no murmur, click, rub or gallop ABDOMEN- abdomen is soft without significant tenderness, masses, organomegaly or guarding NEURO- Grossly normal EXTREMITIES- extremities normal, atraumatic, no cyanosis or edema  Plan   .Fracture closed, femur, shaft/right shoulder pain- appreciate ortho. Xray right shoulder shows degenerative disease. Physical therapy per ortho. likely snf. Marland KitchenHTN (hypertension), benign- BP low normal. Will d/c lopressor, and continue home meds, especially as no surgery planned, and borderline BP. Marland KitchenDementia- stable. Continue home meds. Marland KitchenBPH (benign prostatic hyperplasia)- stable. Continue home meds.  Dvt/gi prophylaxis.     Raylynne Cubbage 09/05/2011 12:20 PM Pager: [phone].

## 2011-09-05 NOTE — H&P (Signed)
Clinical Social Work Department BRIEF PSYCHOSOCIAL ASSESSMENT 09/05/2011  Patient:  Robert Preston, Robert Preston     Account Number:  [id number]     Admit date:  09/03/2011  Clinical Social Worker:  Candie Chroman  Date/Time:  09/05/2011 03:00 PM  Referred by:  Physician  Date Referred:  09/05/2011 Referred for  SNF Placement   Other Referral:   Interview type:  Patient Other interview type:    PSYCHOSOCIAL DATA Living Status:  WIFE Admitted from facility:   Level of care:   Primary support name:  Trevaughn Schear Primary support relationship to patient:  SPOUSE Degree of support available:   supportive    CURRENT CONCERNS Current Concerns  Post-Acute Placement   Other Concerns:    SOCIAL WORK ASSESSMENT / PLAN Pt is an 78 yr old gentleman living at home prior to hospitalization. Met with pt/spouse to assist with d/c planning. PT eval pending. Spouse requesting ST SNF placement. SNF search initiated and [facility] prior approval will be requested once PT eval completed. CSW will follow to assist with d/c planning needs.   Assessment/plan status:  Psychosocial Support/Ongoing Assessment of Needs Other assessment/ plan:   Information/referral to community resources:   SNF list provided    PATIENT'S/FAMILY'S RESPONSE TO PLAN OF CARE: Pt/spouse are interested in ST rehab prior to returning home.    Cori Razor LCSW [phone]

## 2011-09-05 NOTE — H&P (Signed)
Clinical Social Work Department CLINICAL SOCIAL WORK PLACEMENT NOTE 09/05/2011  Patient:  Robert Preston, Robert Preston  Account Number:  [id number] Admit date:  09/03/2011  Clinical Social Worker:  Cori Razor, LCSW  Date/time:  09/05/2011 03:22 PM  Clinical Social Work is seeking post-discharge placement for this patient at the following level of care:   SKILLED NURSING   (*CSW will update this form in Epic as items are completed)   09/05/2011  Patient/family provided with [facility] Department of Clinical Social Work's list of facilities offering this level of care within the geographic area requested by the patient (or if unable, by the patient's family).  09/05/2011  Patient/family informed of their freedom to choose among providers that offer the needed level of care, that participate in Medicare, Medicaid or managed care program needed by the patient, have an available bed and are willing to accept the patient.    Patient/family informed of [facility]' ownership interest in [facility], as well as of the fact that they are under no obligation to receive care at this facility.  PASARR submitted to EDS on 09/05/2011 PASARR number received from [employer]DS on   FL2 transmitted to all facilities in geographic area requested by pt/family on  09/05/2011 FL2 transmitted to all facilities within larger geographic area on   Patient informed that his/her managed care company has contracts with or will negotiate with  certain facilities, including the following:     Patient/family informed of bed offers received:   Patient chooses bed at  Physician recommends and patient chooses bed at    Patient to be transferred to  on   Patient to be transferred to facility by   The following physician request were entered in Epic:   Additional Comments:  Cori Razor LCSW [phone]

## 2011-09-05 NOTE — Progress Notes (Signed)
Subjective: "I feel a little better today" Dr. Rennis Chris asked me to see Robert Preston in regards to his periprosthetic femur fracture.    Objective: Vital signs in last 24 hours: Temp:  [98.1 F (36.7 C)-98.4 F (36.9 C)] 98.2 F (36.8 C) (06/25 0514) Pulse Rate:  [57-65] 57  (06/25 0514) Resp:  [15-16] 15  (06/25 0514) BP: (107-135)/(63-68) 135/68 mmHg (06/25 0514) SpO2:  [95 %-98 %] 95 % (06/25 0514)  Intake/Output from previous day: 06/24 0701 - 06/25 0700 In: 918.3 [P.O.:360; I.V.:558.3] Out: 400 [Urine:400] Intake/Output this shift:     Basename 09/05/11 0420 09/04/11 0404 09/03/11 0850  HGB 10.5* 10.8* 11.7*    Basename 09/05/11 0420 09/04/11 0404  WBC 9.8 8.5  RBC 3.21* 3.26*  HCT 31.4* 32.0*  PLT 211 219    Basename 09/05/11 0420 09/04/11 0404  NA 137 138  K 3.9 4.1  CL 101 104  CO2 27 28  BUN 25* 25*  CREATININE 1.06 1.12  GLUCOSE 109* 111*  CALCIUM 9.1 9.2    Basename 09/04/11 0404  LABPT --  INR 1.18    Neurovascular intact Dorsiflexion/Plantar flexion intact RLE Tender over greater trochanter  Assessment/Plan: R greater trochanteric non-displaced femur fracture-  i saw Robert Preston last night and this AM and he is comfortable. This can be treated non-operatively. He can be WBAT RLE and can be up with PT. Will need follow-up in office in 2 weeks for repeat x-rays to make sure the fracture does not displace. Will sign off. Please call with any questions or concerns.   Loanne Drilling 09/05/2011, 7:41 AM

## 2011-09-06 DIAGNOSIS — N400 Enlarged prostate without lower urinary tract symptoms: Secondary | ICD-10-CM

## 2011-09-06 DIAGNOSIS — I10 Essential (primary) hypertension: Secondary | ICD-10-CM

## 2011-09-06 DIAGNOSIS — F05 Delirium due to known physiological condition: Secondary | ICD-10-CM

## 2011-09-06 DIAGNOSIS — S72009A Fracture of unspecified part of neck of unspecified femur, initial encounter for closed fracture: Secondary | ICD-10-CM

## 2011-09-06 DIAGNOSIS — F0390 Unspecified dementia without behavioral disturbance: Secondary | ICD-10-CM

## 2011-09-06 NOTE — Progress Notes (Signed)
Subjective: No acute complaints today.  Wanted to go to [facility] home but there were no bed available.  Currently looking for placement at [facility].   Objective: Filed Vitals:   09/05/11 2136 09/06/11 0257 09/06/11 0537 09/06/11 1422  BP: 100/58  156/67 122/66  Pulse: 63 68 70 60  Temp: 98.6 F (37 C)  98.5 F (36.9 C) 98.2 F (36.8 C)  TempSrc: Oral  Oral Oral  Resp: 16  18 18   Height:      Weight:      SpO2: 95% 98% 95% 97%   Weight change:   Intake/Output Summary (Last 24 hours) at 09/06/11 1757 Last data filed at 09/06/11 1218  Gross per 24 hour  Intake    720 ml  Output    200 ml  Net    520 ml    General: Alert, awake,in no acute distress.  HEENT: No bruits, no goiter.  Heart: Regular rate and rhythm, without murmurs, rubs, gallops.  Lungs: Clear to auscultation, bilateral air movement.  Abdomen: Soft, nontender, nondistended, positive bowel sounds.  Neuro: Grossly intact, nonfocal. Extremities:  Pain over right hip with palpation.  No clubbing or cyanosis noted.   Lab Results:  [facility] 09/05/11 0420 09/04/11 0404  NA 137 138  K 3.9 4.1  CL 101 104  CO2 27 28  GLUCOSE 109* 111*  BUN 25* 25*  CREATININE 1.06 1.12  CALCIUM 9.1 9.2  MG -- --  PHOS -- --    Basename 09/04/11 0404  AST 20  ALT 15  ALKPHOS 75  BILITOT 0.5  PROT 6.3  ALBUMIN 3.4*   No results found for this basename: LIPASE:2,AMYLASE:2 in the last 72 hours  Basename 09/05/11 0420 09/04/11 0404  WBC 9.8 8.5  NEUTROABS -- --  HGB 10.5* 10.8*  HCT 31.4* 32.0*  MCV 97.8 98.2  PLT 211 219   No results found for this basename: CKTOTAL:3,CKMB:3,CKMBINDEX:3,TROPONINI:3 in the last 72 hours No components found with this basename: POCBNP:3 No results found for this basename: DDIMER:2 in the last 72 hours No results found for this basename: HGBA1C:2 in the last 72 hours No results found for this basename: CHOL:2,HDL:2,LDLCALC:2,TRIG:2,CHOLHDL:2,LDLDIRECT:2 in the last 72  hours  Basename 09/04/11 0404  TSH 1.024  T4TOTAL --  T3FREE --  THYROIDAB --    Basename 09/04/11 0427  VITAMINB12 440  FOLATE --  FERRITIN --  TIBC --  IRON --  RETICCTPCT --    Micro Results: No results found for this or any previous visit (from the past 240 hour(s)).  Studies/Results: No results found.  Medications: I have reviewed the patient's current medications.   Patient Active Hospital Problem List: Fracture closed, femur, shaft (09/03/2011) Per ortho can be treated non operatively with WBAT RLE and can be up with PT Will need follow-up in office in 2 weeks for repeat x-rays to make sure fracture does not displace.  HTN (hypertension), benign (09/03/2011) Currently well controlled and last blood pressure was 122/66.  Based on current medication patient is not on any antihypertensive medications.  Dementia (09/03/2011) Stable continue home regimen.  Hypothyroidism: Continue levothyroxine  BPH (benign prostatic hyperplasia) (09/03/2011) Stable continue home regimen.  Depression: Stable on zoloft.     LOS: 3 days   Penny Pia M.D.  [facility] 09/06/2011, 5:57 PM

## 2011-09-06 NOTE — Progress Notes (Signed)
CSW assisting with SNF placement. Awaiting return call from [facility] to check bed availability. PT has completed eval and clinicals have been faxed to [facility]. Expect prior approval by Thurs. Will continue to assist with d/c planning.  Cori Razor LCSW [phone]

## 2011-09-06 NOTE — Evaluation (Signed)
Physical Therapy Evaluation Patient Details Name: Robert Preston MRN: [id number] DOB: [birth year] Today's Date: 09/06/2011 Time: 4098-1191 PT Time Calculation (min): 33 min  PT Assessment / Plan / Recommendation Clinical Impression  Pt presents with R periprosthetic non displaced intertrochanteric fx.  Tolerated transfer from bed to 3in1 and some steps from 3in1 to recliner, however requires +2 assist to maintain upright posture and step safely.  Pt will benefit from skilled PT in acute care to address deficits.  PT recommends SNF for follow up therapy to increase pt safety and decrease burden of care.     PT Assessment  Patient needs continued PT services    Follow Up Recommendations  Skilled nursing facility;Supervision/Assistance - 24 hour    Barriers to Discharge Decreased caregiver support Wife unable to care for pt appropriately    Equipment Recommendations  Defer to next venue    Recommendations for Other Services     Frequency Min 3X/week    Precautions / Restrictions Precautions Precautions: Fall Restrictions Weight Bearing Restrictions: No Other Position/Activity Restrictions: WBAT   Pertinent Vitals/Pain PAINAD score 4/10      Mobility  Bed Mobility Bed Mobility: Supine to Sit;Sitting - Scoot to Edge of Bed Supine to Sit: 1: +2 Total assist;HOB elevated Supine to Sit: Patient Percentage: 20% Sitting - Scoot to Edge of Bed: 1: +2 Total assist Sitting - Scoot to Edge of Bed: Patient Percentage: 40% Details for Bed Mobility Assistance: Pt requires assist for B LE off of bed and for trunk to attain sitting position.  He initially assisted with LLE, however due to increased pain required more assist.  Cues for hand placement to self assist.  Transfers Transfers: Sit to Stand;Stand to Sit Sit to Stand: 1: +2 Total assist;From elevated surface;With upper extremity assist;From bed Sit to Stand: Patient Percentage: 50% Stand to Sit: 1: +2 Total assist;With upper  extremity assist;With armrests;To chair/3-in-1 Stand to Sit: Patient Percentage: 40% Details for Transfer Assistance: Assist to rise and stabalize with max cuing for hand placement and glute activation for upright posture.  Increased assist for sitting due to pt fatigued and needing to sit.  Ambulation/Gait Ambulation/Gait Assistance: 1: +2 Total assist Ambulation/Gait: Patient Percentage: 30% Ambulation Distance (Feet): 5 Feet Assistive device: Rolling walker Ambulation/Gait Assistance Details: Assist to maintain posture, weight shift to the right and advance RLE.  Provided tapping for R hamstring in order to faciliate stepping.  Max cuing for sequencing/technique with RW with assist for RW placement.  Gait Pattern: Step-to pattern;Lateral trunk lean to left;Trunk flexed;Decreased stride length;Narrow base of support Gait velocity: decreased Stairs: No Wheelchair Mobility Wheelchair Mobility: No    Exercises     PT Diagnosis: Difficulty walking;Abnormality of gait;Generalized weakness;Acute pain  PT Problem List: Decreased strength;Decreased range of motion;Decreased activity tolerance;Decreased balance;Decreased mobility;Decreased cognition;Decreased knowledge of use of DME;Decreased safety awareness;Pain PT Treatment Interventions: DME instruction;Gait training;Functional mobility training;Therapeutic activities;Therapeutic exercise;Balance training;Patient/family education   PT Goals Acute Rehab PT Goals PT Goal Formulation: Patient unable to participate in goal setting Time For Goal Achievement: 09/20/11 Potential to Achieve Goals: Fair Pt will go Supine/Side to Sit: with min assist PT Goal: Supine/Side to Sit - Progress: Goal set today Pt will go Sit to Supine/Side: with min assist PT Goal: Sit to Supine/Side - Progress: Goal set today Pt will go Stand to Sit: with min assist PT Goal: Stand to Sit - Progress: Goal set today Pt will Transfer Bed to Chair/Chair to Bed: with min  assist PT Transfer Goal: Bed  to Chair/Chair to Bed - Progress: Goal set today Pt will Ambulate: 1 - 15 feet;with mod assist;with least restrictive assistive device PT Goal: Ambulate - Progress: Goal set today  Visit Information  Last PT Received On: 09/06/11 Assistance Needed: +2    Subjective Data  Subjective: No one has come to talk to me about my hip in about 2 months Patient Stated Goal: n/a   Prior Functioning  Home Living Lives With: Spouse Available Help at Discharge: Skilled Nursing Facility Home Adaptive Equipment: Walker - rolling Additional Comments: Pt will be D/C-ing to SNF, however he states that he was using a walker to get around before.  Prior Function Comments: Unable of PLOF due to decreased cognition.  Communication Communication: HOH    Cognition  Overall Cognitive Status: No family/caregiver present to determine baseline cognitive functioning Arousal/Alertness: Awake/alert Orientation Level: Disoriented X4;Place;Time Behavior During Session: WFL for tasks performed Cognition - Other Comments: only somewhat oriented to situation    Extremity/Trunk Assessment Right Lower Extremity Assessment RLE ROM/Strength/Tone: Unable to fully assess;Due to pain;Due to impaired cognition RLE Coordination: WFL - gross motor Left Lower Extremity Assessment LLE ROM/Strength/Tone: WFL for tasks assessed LLE Coordination: WFL - gross motor Trunk Assessment Trunk Assessment: Kyphotic   Balance Balance Balance Assessed: Yes Static Standing Balance Static Standing - Balance Support: Bilateral upper extremity supported Static Standing - Level of Assistance: 3: Mod assist Static Standing - Comment/# of Minutes: Mod assist while cleaning following using 3in1.  stood approx 5 mins  End of Session PT - End of Session Equipment Utilized During Treatment: Gait belt Activity Tolerance: Patient limited by fatigue;Patient limited by pain Patient left: in chair;with call bell/phone  within reach Nurse Communication: Mobility status  GP Functional Assessment Tool Used: Combination of functional mobility assessed (bed mobility, transfers, and gait)  Functional Limitation: Mobility: Walking and moving around Mobility: Walking and Moving Around Current Status (W1191): At least 60 percent but less than 80 percent impaired, limited or restricted   Lessie Dings 09/06/2011, 11:21 AM

## 2011-09-07 DIAGNOSIS — F0390 Unspecified dementia without behavioral disturbance: Secondary | ICD-10-CM

## 2011-09-07 DIAGNOSIS — N400 Enlarged prostate without lower urinary tract symptoms: Secondary | ICD-10-CM

## 2011-09-07 DIAGNOSIS — S72009A Fracture of unspecified part of neck of unspecified femur, initial encounter for closed fracture: Secondary | ICD-10-CM

## 2011-09-07 DIAGNOSIS — I10 Essential (primary) hypertension: Secondary | ICD-10-CM

## 2011-09-07 DIAGNOSIS — F05 Delirium due to known physiological condition: Secondary | ICD-10-CM

## 2011-09-07 MED ORDER — HYDROCODONE-ACETAMINOPHEN 5-325 MG PO TABS: 1.0000 | ORAL_TABLET | ORAL | Status: AC | PRN

## 2011-09-07 MED ORDER — SENNA 8.6 MG PO TABS: 1.0000 | ORAL_TABLET | Freq: Every day | ORAL | Status: DC | PRN

## 2011-09-07 NOTE — Discharge Summary (Signed)
Physician Discharge Summary  DEQUAVION FOLLETTE ZOX:[id number] DOB: 04-24-1922 DOA: 09/03/2011  PCP: Nadean Corwin, MD  Admit date: 09/03/2011 Discharge date: 09/07/2011  Discharge Diagnoses:  Active Problems:  Fracture closed, femur, shaft  HTN (hypertension), benign  Dementia  BPH (benign prostatic hyperplasia)   Discharge Condition: Stable  Disposition:   Diet: Low salt diet  History of present illness:  From original HPI: Mr Steely was busy dressing himself to head out with his wife this morning when he fell, jarring his right leg on the floor. He was found to have a periprosthetic fracture of the right femur. Dr Supple(orthopedics) has been consulted, and we were asked to admit this pleasant elderly gentleman to the medical service. His wife mentions that he has fallen a number of times in the last few months, but was in relative good health when he fell. No angina or SOB.   Hospital Course:   Fracture closed, femur, shaft (09/03/2011) Per ortho can be treated non operatively with WBAT RLE and can be up with PT  Will need follow-up in office in 2 weeks for repeat x-rays to make sure fracture does not displace.  D/C to [facility] place with Physical therapy  HTN (hypertension), benign (09/03/2011) Based on current medication patient is not on any antihypertensive medications. Would recommend low salt diet.  May be elevated due to pain.  Will provide medication for pain control  Dementia (09/03/2011) Stable continue home regimen.   Hypothyroidism:  Continue levothyroxine   BPH (benign prostatic hyperplasia) (09/03/2011) Stable continue home regimen.   Depression:  Stable on zoloft.   Discharge Exam: Filed Vitals:   09/07/11 0510  BP: 151/71  Pulse: 57  Temp: 98.1 F (36.7 C)  Resp: 16   Filed Vitals:   09/06/11 0537 09/06/11 1422 09/06/11 2134 09/07/11 0510  BP: 156/67 122/66 129/63 151/71  Pulse: 70 60 67 57  Temp: 98.5 F (36.9 C) 98.2 F (36.8 C) 97.6 F  (36.4 C) 98.1 F (36.7 C)  TempSrc: Oral Oral Oral Oral  Resp: 18 18 14 16   Height:      Weight:      SpO2: 95% 97% 98% 97%   General: Patient in NAD, laying supine smiling Cardiovascular: No edema, with peripheral pulses Respiratory: No increased work of breathing, speaks in full sentences.  Discharge Instructions  Discharge Orders    Future Orders Please Complete By Expires   Diet - low sodium heart healthy      Increase activity slowly      Call MD for:  temperature >100.4      Call MD for:  redness, tenderness, or signs of infection (pain, swelling, redness, odor or green/yellow discharge around incision site)      Call MD for:  severe uncontrolled pain      Discharge instructions      Comments:   Follow up with Ollen Gross (Orthopaedics) [phone] in 2 weeks for repeat x ray of affected limb.  Please read all information regarding current drug regimen.  If any questions please be sure to ask your local pharmacist or primary care physician.   Driving Restrictions      Comments:   May not drive while on opiods.     Medication List  As of 09/07/2011 12:04 PM   TAKE these medications         aspirin EC 81 MG tablet   Take 81 mg by mouth daily.      donepezil 10 MG tablet  Commonly known as: ARICEPT   Take 10 mg by mouth at bedtime.      finasteride 5 MG tablet   Commonly known as: PROSCAR   Take 5 mg by mouth daily.      FLAXSEED OIL PO   Take 1 capsule by mouth daily.      HYDROcodone-acetaminophen 5-325 MG per tablet   Commonly known as: NORCO   Take 1-2 tablets by mouth every 4 (four) hours as needed for pain.      levothyroxine 50 MCG tablet   Commonly known as: SYNTHROID, LEVOTHROID   Take 50 mcg by mouth daily.      loratadine 10 MG tablet   Commonly known as: CLARITIN   Take 10 mg by mouth daily.      LORazepam 0.5 MG tablet   Commonly known as: ATIVAN   Take 0.5 mg by mouth every 8 (eight) hours as needed. For anxiety.      memantine 10  MG tablet   Commonly known as: NAMENDA   Take 10 mg by mouth daily.      senna 8.6 MG Tabs   Commonly known as: SENOKOT   Take 1 tablet (8.6 mg total) by mouth daily as needed (constipation).      sertraline 50 MG tablet   Commonly known as: ZOLOFT   Take 50 mg by mouth daily.      Tamsulosin HCl 0.4 MG Caps   Commonly known as: FLOMAX   Take 0.4 mg by mouth daily.      terazosin 2 MG capsule   Commonly known as: HYTRIN   Take 2 mg by mouth at bedtime.      Vitamin D 2000 UNITS tablet   Take 2,000 Units by mouth daily.              The results of significant diagnostics from this hospitalization (including imaging, microbiology, ancillary and laboratory) are listed below for reference.    Significant Diagnostic Studies: Dg Chest 2 View  09/03/2011  *RADIOLOGY REPORT*  Clinical Data: Fall  CHEST - 2 VIEW  Comparison: None.  Findings: Normal heart size.  Clear lungs.  No pneumothorax.  No pleural effusion. Hyperaeration.  IMPRESSION: No active cardiopulmonary disease.  Original Report Authenticated By: Donavan Burnet, M.D.   Dg Shoulder Right  09/04/2011  *RADIOLOGY REPORT*  Clinical Data: Recent fall.  Possible fracture.  Previous rotator cuff surgery.  RIGHT SHOULDER - 2+ VIEW  Comparison: None.  Findings: There are no fractures or dislocations.  There are mild glenohumeral joint space degenerative changes.  There are no destructive changes.  IMPRESSION: Mild degenerative changes.  No acute findings.  Original Report Authenticated By: Rolla Plate, M.D.   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  The position of the femoral components with respect to the acetabular components is stable.  There is an acute fracture involving the proximal right femur, just below the greater trochanter.  The distal femur is intact.  There is lucency  surrounding the femoral component of the right total hip arthroplasty superiorly  which is stable.  Osteopenia.  Otherwise no evidence of acute fracture.  IMPRESSION: Acute proximal right femur fracture just below the greater trochanter.  Total hip arthroplasty is otherwise stable in appearance.  Lucency surrounds the proximal femoral prosthetic component.  Original Report Authenticated By: Donavan Burnet,  M.D.   Dg Knee Complete 4 Views Right  09/03/2011  *RADIOLOGY REPORT*  Clinical Data: Fall  RIGHT KNEE - COMPLETE 4+ VIEW  Comparison: None.  Findings: No acute fracture and no dislocation.  Mild degenerative changes.  IMPRESSION: No acute bony pathology.  Original Report Authenticated By: Donavan Burnet, M.D.    Microbiology: No results found for this or any previous visit (from the past 240 hour(s)).   Labs: Basic Metabolic Panel:  Lab 09/05/11 9528 09/04/11 0404 09/03/11 0850  NA 137 138 138  K 3.9 4.1 --  CL 101 104 101  CO2 27 28 27   GLUCOSE 109* 111* 95  BUN 25* 25* 30*  CREATININE 1.06 1.12 1.09  CALCIUM 9.1 9.2 9.7  MG -- -- --  PHOS -- -- --   Liver Function Tests:  Lab 09/04/11 0404  AST 20  ALT 15  ALKPHOS 75  BILITOT 0.5  PROT 6.3  ALBUMIN 3.4*   No results found for this basename: LIPASE:5,AMYLASE:5 in the last 168 hours No results found for this basename: AMMONIA:5 in the last 168 hours CBC:  Lab 09/05/11 0420 09/04/11 0404 09/03/11 0850  WBC 9.8 8.5 8.2  NEUTROABS -- -- 5.8  HGB 10.5* 10.8* 11.7*  HCT 31.4* 32.0* 34.6*  MCV 97.8 98.2 97.2  PLT 211 219 236   Cardiac Enzymes: No results found for this basename: CKTOTAL:5,CKMB:5,CKMBINDEX:5,TROPONINI:5 in the last 168 hours BNP: No components found with this basename: POCBNP:5 CBG: No results found for this basename: GLUCAP:5 in the last 168 hours  Time coordinating discharge: >35 minutes placing orders, medical decision making, documenting, discussion with family/social worker  Signed:  Penny Pia  [facility] 09/07/2011, 12:04 PM

## 2011-09-07 NOTE — Progress Notes (Signed)
CSW assisting with d/c planning. Pt has SNF bed at [facility] today if stable for d/c. CSW will to continue to follow to assist with d/c planning to SNF.  Cori Razor LCSW [phone]

## 2011-09-07 NOTE — Progress Notes (Signed)
Gave report to [place] at [facility]. Gave pt 1 norco at d/c for pain. Pt has no other signs of distress. D/C'd by non-emergency ambulance.

## 2011-09-07 NOTE — Progress Notes (Signed)
Clinical Social Work Department CLINICAL SOCIAL WORK PLACEMENT NOTE 09/07/2011  Patient:  Robert Preston, Robert Preston  Account Number:  [id number] Admit date:  09/03/2011  Clinical Social Worker:  Cori Razor, LCSW  Date/time:  09/05/2011 03:22 PM  Clinical Social Work is seeking post-discharge placement for this patient at the following level of care:   SKILLED NURSING   (*CSW will update this form in Epic as items are completed)   09/05/2011  Patient/family provided with [facility] Department of Clinical Social Work's list of facilities offering this level of care within the geographic area requested by the patient (or if unable, by the patient's family).  09/05/2011  Patient/family informed of their freedom to choose among providers that offer the needed level of care, that participate in Medicare, Medicaid or managed care program needed by the patient, have an available bed and are willing to accept the patient.    Patient/family informed of [facility]' ownership interest in [facility], as well as of the fact that they are under no obligation to receive care at this facility.  PASARR submitted to EDS on 09/05/2011 PASARR number received from EDS on   FL2 transmitted to all facilities in geographic area requested by pt/family on  09/05/2011 FL2 transmitted to all facilities within larger geographic area on   Patient informed that his/her managed care company has contracts with or will negotiate with  certain facilities, including the following:     Patient/family informed of bed offers received:  09/06/2011 Patient chooses bed at [facility] Physician recommends and patient chooses bed at    Patient to be transferred to [facility] on  09/07/2011 Patient to be transferred to facility by [facility]  The following physician request were entered in Epic:   Additional Comments:  [employer] provided prior approval for SNF placement and ambulance transport prior to  d/c.  Cori Razor LCSW [phone]

## 2011-09-08 NOTE — Care Management Note (Signed)
    Page 1 of 2   09/08/2011     9:21:52 AM   CARE MANAGEMENT NOTE 09/08/2011  Patient:  Robert Preston, Robert Preston   Account Number:  [id number]  Date Initiated:  09/04/2011  Documentation initiated by:  Colleen Can  Subjective/Objective Assessment:   DX PERIPROTHETIC HIP FRACTURE     Action/Plan:   SNF PLACEMENT   Anticipated DC Date:  09/07/2011   Anticipated DC Plan:  SKILLED NURSING FACILITY  In-house referral  Clinical Social Worker      DC Planning Services  NA      [facility] Choice  NA   Choice offered to / List presented to:  NA   DME arranged  NA      DME agency  NA     HH arranged  NA      HH agency  NA   Status of service:  Completed, signed off Medicare Important Message given?   (If response is "NO", the following Medicare IM given date fields will be blank) Date Medicare IM given:   Date Additional Medicare IM given:    Discharge Disposition:  SKILLED NURSING FACILITY  Per UR Regulation:  Reviewed for med. necessity/level of care/duration of stay  If discussed at Long Length of Stay Meetings, dates discussed:    Comments:

## 2011-10-26 ENCOUNTER — Encounter (HOSPITAL_COMMUNITY): Payer: Self-pay | Admitting: *Deleted

## 2011-10-26 ENCOUNTER — Emergency Department (HOSPITAL_COMMUNITY): Payer: Medicare Other

## 2011-10-26 ENCOUNTER — Emergency Department (HOSPITAL_COMMUNITY)
Admission: EM | Admit: 2011-10-26 | Discharge: 2011-10-26 | Disposition: A | Discharge status: Home / Self Care | Payer: Medicare Other | Attending: Emergency Medicine | Admitting: Emergency Medicine

## 2011-10-26 DIAGNOSIS — E039 Hypothyroidism, unspecified: Secondary | ICD-10-CM | POA: Insufficient documentation

## 2011-10-26 DIAGNOSIS — M542 Cervicalgia: Secondary | ICD-10-CM | POA: Insufficient documentation

## 2011-10-26 DIAGNOSIS — E7800 Pure hypercholesterolemia, unspecified: Secondary | ICD-10-CM | POA: Insufficient documentation

## 2011-10-26 DIAGNOSIS — F068 Other specified mental disorders due to known physiological condition: Secondary | ICD-10-CM | POA: Insufficient documentation

## 2011-10-26 DIAGNOSIS — Z79899 Other long term (current) drug therapy: Secondary | ICD-10-CM | POA: Insufficient documentation

## 2011-10-26 DIAGNOSIS — M25559 Pain in unspecified hip: Secondary | ICD-10-CM | POA: Insufficient documentation

## 2011-10-26 DIAGNOSIS — Z96659 Presence of unspecified artificial knee joint: Secondary | ICD-10-CM | POA: Insufficient documentation

## 2011-10-26 DIAGNOSIS — R51 Headache: Secondary | ICD-10-CM | POA: Insufficient documentation

## 2011-10-26 DIAGNOSIS — I10 Essential (primary) hypertension: Secondary | ICD-10-CM | POA: Insufficient documentation

## 2011-10-26 DIAGNOSIS — M545 Low back pain, unspecified: Secondary | ICD-10-CM | POA: Insufficient documentation

## 2011-10-26 DIAGNOSIS — R296 Repeated falls: Secondary | ICD-10-CM | POA: Insufficient documentation

## 2011-10-26 DIAGNOSIS — W19XXXA Unspecified fall, initial encounter: Secondary | ICD-10-CM

## 2011-10-26 NOTE — ED Notes (Signed)
Per pt's family, pt just DC from rehab on Monday.

## 2011-10-26 NOTE — ED Notes (Signed)
Per [facility], pt from home with reports of falling while walking across living room without walker. Per EMS, pt's wife reports advanced dementia with recent right hip fracture and pt forgets to use walker. EMS also reports that pt hit back of head but denies loss of consciousness. EMS also reports slow speech but family reports that this is not new.

## 2011-10-26 NOTE — ED Provider Notes (Signed)
History     CSN: [id number]  Arrival date & time 10/26/11  1251   First MD Initiated Contact with Patient 10/26/11 1302      Chief Complaint  Patient presents with  . Fall  . Back Pain    Lower  . Hip Pain    Right    (Consider location/radiation/quality/duration/timing/severity/associated sxs/prior treatment) Patient is a 78 y.o. male presenting with fall, back pain, and hip pain. The history is provided by the patient and the spouse. The history is limited by the condition of the patient (dementia).  Fall  Back Pain   Hip Pain  He had recent surgery on his right hip and he is supposed to use a walker when walking. However, got up from a chair and forgot to use his walker, got across the room and fell. He was unable to get himself back up. He is reported to have complained of pain in his back and right hip, but he denies pain at the time that I am examining him. He was treated by EMS with full spinal immobilization and stabilization for transport.  Past Medical History  Diagnosis Date  . Hypertension   . Hypercholesterolemia   . DEMENTIA   . Arthritis   . Hypothyroidism   . Neuromuscular disorder     carpal tunnel    Past Surgical History  Procedure Date  . Joint replacement   . Eye surgery     lasik both eyes  . Transurethral resection of prostate [id number]  . Shoulder arthroscopy 2005    right  . Shoulder arthroscopy 2002    rt  . Total hip arthroplasty     right and left  . Cholecystectomy   . Colonoscopy   . Carpal tunnel release 05/10/2011    Procedure: CARPAL TUNNEL RELEASE;  Surgeon: Nicki Reaper, MD;  Location: [facility];  Service: Orthopedics;  Laterality: Right;    History reviewed. No pertinent family history.  History  Substance Use Topics  . Smoking status: Never Smoker   . Smokeless tobacco: Never Used  . Alcohol Use: No     quit 2 years ago      Review of Systems  Unable to perform ROS: Dementia  Musculoskeletal:  Positive for back pain.    Allergies  Penicillins  Home Medications   Current Outpatient Rx  Name Route Sig Dispense Refill  . ASPIRIN EC 81 MG PO TBEC Oral Take 81 mg by mouth daily.    Marland Kitchen VITAMIN D 2000 UNITS PO TABS Oral Take 2,000 Units by mouth daily.    . DONEPEZIL HCL 10 MG PO TABS Oral Take 10 mg by mouth at bedtime.     Marland Kitchen FINASTERIDE 5 MG PO TABS Oral Take 5 mg by mouth daily.      Marland Kitchen LEVOTHYROXINE SODIUM 50 MCG PO TABS Oral Take 50 mcg by mouth daily.    Marland Kitchen LISINOPRIL 20 MG PO TABS Oral Take 10 mg by mouth daily.    Marland Kitchen LORATADINE 10 MG PO TABS Oral Take 10 mg by mouth daily.      Marland Kitchen LORAZEPAM 0.5 MG PO TABS Oral Take 0.5 mg by mouth every 8 (eight) hours as needed. For anxiety.    Marland Kitchen MEMANTINE HCL 10 MG PO TABS Oral Take 10 mg by mouth daily.    . SENNA 8.6 MG PO TABS Oral Take 1 tablet (8.6 mg total) by mouth daily as needed (constipation). 20 each 0  . SERTRALINE HCL 50  MG PO TABS Oral Take 50 mg by mouth daily.    Marland Kitchen SIMVASTATIN 80 MG PO TABS Oral Take 40 mg by mouth at bedtime.    . TAMSULOSIN HCL 0.4 MG PO CAPS Oral Take 0.4 mg by mouth daily.    Marland Kitchen TERAZOSIN HCL 2 MG PO CAPS Oral Take 2 mg by mouth at bedtime.      BP 160/77  Pulse 79  Temp 97.5 F (36.4 C) (Oral)  Resp 18  SpO2 100%  Physical Exam  Nursing note and vitals reviewed. 78year old male, long spine board with stiff cervical collar in place, and in no acute distress. Vital signs are significant for mild hypertension with blood pressure 160/77. Oxygen saturation is 100%, which is normal. Head is normocephalic and atraumatic. PERRLA, EOMI. Oropharynx is clear. Neck is nontenderwithout adenopathy or JVD. Stiff cervical collar is in place Back is nontender and there is no CVA tenderness. Lungs are clear without rales, wheezes, or rhonchi. Chest is nontender. Heart has regular rate and rhythm without murmur. Abdomen is soft, flat, nontender without masses or hepatosplenomegaly and peristalsis is  normoactive. Extremities have no cyanosis or edema. There is no shortening or internal rotation of the right leg to suggest hip dislocation. He has generalized increased muscle time but full range of motion is present in all joints. There is a minor abrasion to his right forearm. There is no pain with range of motion of the knee joint and no areas of tenderness to palpation. Skin is warm and dry without rash. Neurologic: He is awake and alert and oriented to person, but he is not oriented to place or time. Speech is slow and slightly slurred, but his wife states that this is his baseline speech. Cranial nerves are intact, there are no motor or sensory deficits.   ED Course  Procedures (including critical care time)  Dg Hip Complete Left  10/26/2011  *RADIOLOGY REPORT*  Clinical Data: Recent fall, left hip pain  LEFT HIP - COMPLETE 2+ VIEW  Comparison: Pelvis left hip films of 02/15/2011  Findings: Bilateral total hip replacements are present.  The femoral and acetabular components appear unchanged in position on the frontal view.  The bones are diffusely osteopenic.  No acute fracture is seen.  There are degenerative changes noted in the lower lumbar spine.  IMPRESSION: Stable appearance of bilateral total hip replacements.  Diffuse osteopenia with degenerative change in the lower lumbar spine.  Original Report Authenticated By: Juline Patch, M.D.   Ct Head Wo Contrast  10/26/2011  *RADIOLOGY REPORT*  Clinical Data:  Larey Seat and hit head.  Neck pain.  Head pain.  CT HEAD WITHOUT CONTRAST CT CERVICAL SPINE WITHOUT CONTRAST  Technique:  Multidetector CT imaging of the head and cervical spine was performed following the standard protocol without intravenous contrast.  Multiplanar CT image reconstructions of the cervical spine were also generated.  Comparison:  MRI cervical spine 04/11/2011.  CT head cervical spine 03/16/2011.  CT HEAD  Findings: There is no evidence for acute infarction, intracranial  hemorrhage, mass lesion, hydrocephalus, or extra-axial fluid. Moderate to severe atrophy is present.  Advanced chronic microvascular ischemic change is present in the periventricular and subcortical white matter.  The calvarium is intact.  There is moderate vascular calcification. There is no acute sinus or mastoid fluid.  There is a similar appearance to priors.  IMPRESSION: Atrophy and chronic microvascular ischemic change.  No skull fracture or intracranial hemorrhage.  CT CERVICAL SPINE  Findings: No  visible cervical spine fracture or traumatic subluxation.  No prevertebral soft tissue swelling or visible intraspinal hematoma.  Trachea midline.  Lung apices clear.  No upper rib fracture.  Advanced disc space narrowing C3-C7. Bilateral carotid calcification.  Multilevel facet arthropathy.  IMPRESSION: No visible cervical spine fracture or traumatic subluxation. Similar appearance to priors.  Original Report Authenticated By: Elsie Stain, M.D.   Ct Cervical Spine Wo Contrast  10/26/2011  *RADIOLOGY REPORT*  Clinical Data:  Larey Seat and hit head.  Neck pain.  Head pain.  CT HEAD WITHOUT CONTRAST CT CERVICAL SPINE WITHOUT CONTRAST  Technique:  Multidetector CT imaging of the head and cervical spine was performed following the standard protocol without intravenous contrast.  Multiplanar CT image reconstructions of the cervical spine were also generated.  Comparison:  MRI cervical spine 04/11/2011.  CT head cervical spine 03/16/2011.  CT HEAD  Findings: There is no evidence for acute infarction, intracranial hemorrhage, mass lesion, hydrocephalus, or extra-axial fluid. Moderate to severe atrophy is present.  Advanced chronic microvascular ischemic change is present in the periventricular and subcortical white matter.  The calvarium is intact.  There is moderate vascular calcification. There is no acute sinus or mastoid fluid.  There is a similar appearance to priors.  IMPRESSION: Atrophy and chronic microvascular  ischemic change.  No skull fracture or intracranial hemorrhage.  CT CERVICAL SPINE  Findings: No visible cervical spine fracture or traumatic subluxation.  No prevertebral soft tissue swelling or visible intraspinal hematoma.  Trachea midline.  Lung apices clear.  No upper rib fracture.  Advanced disc space narrowing C3-C7. Bilateral carotid calcification.  Multilevel facet arthropathy.  IMPRESSION: No visible cervical spine fracture or traumatic subluxation. Similar appearance to priors.  Original Report Authenticated By: Elsie Stain, M.D.     1. Fall       MDM  Fall without evidence of significant injury. X-rays will be obtained of the right hip and he he will be sent for CT scan of his head and cervical spine.  X-rays show no evidence of fracture or dislocation. CT scans are unremarkable. He'll be discharged.      Dione Booze, MD 10/26/11 1459

## 2011-10-26 NOTE — ED Notes (Signed)
WUJ:[id number]<JY> Expected date:10/26/11<BR> Expected time:12:33 PM<BR> Means of arrival:<BR> Comments:<BR> Male fall from home.

## 2011-10-30 ENCOUNTER — Ambulatory Visit: Payer: Medicare Other

## 2011-10-30 ENCOUNTER — Ambulatory Visit (INDEPENDENT_AMBULATORY_CARE_PROVIDER_SITE_OTHER): Payer: Medicare Other | Admitting: Emergency Medicine

## 2011-10-30 VITALS — BP 100/64 | HR 81 | Temp 98.4°F | Resp 20

## 2011-10-30 DIAGNOSIS — S53409A Unspecified sprain of unspecified elbow, initial encounter: Secondary | ICD-10-CM

## 2011-10-30 DIAGNOSIS — W19XXXA Unspecified fall, initial encounter: Secondary | ICD-10-CM

## 2011-10-30 DIAGNOSIS — IMO0002 Reserved for concepts with insufficient information to code with codable children: Secondary | ICD-10-CM

## 2011-10-30 DIAGNOSIS — M25539 Pain in unspecified wrist: Secondary | ICD-10-CM

## 2011-10-30 DIAGNOSIS — M25519 Pain in unspecified shoulder: Secondary | ICD-10-CM

## 2011-10-30 NOTE — Progress Notes (Signed)
Date:  10/30/2011   Name:  Robert Preston   DOB:  [birth year]   MRN:  [id number] Gender: male  Age: 78 y.o.  PCP:  Nadean Corwin, MD    Chief Complaint: Arm Pain   History of Present Illness:  Robert Preston is a 78 y.o. pleasant patient who presents with the following:  Injured last week Thursday in a fall outside his home and was treated at [facility] ED.  Had negative neck and skull.  Interval has been significant for constant complaint of pain in his left arm from shoulder to the wrist accompanied by an inability to move his arm usefully.  Denies any neuro or visual symptoms that are new.  He is diagnosed with dementia.  Spent six weeks at rehab facility following hip surgery and was walking outside without his walker preceding his fall.  Patient Active Problem List  Diagnosis  . Fracture closed, femur, shaft  . HTN (hypertension), benign  . Dementia  . BPH (benign prostatic hyperplasia)    Past Medical History  Diagnosis Date  . Hypertension   . Hypercholesterolemia   . DEMENTIA   . Arthritis   . Hypothyroidism   . Neuromuscular disorder     carpal tunnel    Past Surgical History  Procedure Date  . Joint replacement   . Eye surgery     lasik both eyes  . Transurethral resection of prostate [id number]  . Shoulder arthroscopy 2005    right  . Shoulder arthroscopy 2002    rt  . Total hip arthroplasty     right and left  . Cholecystectomy   . Colonoscopy   . Carpal tunnel release 05/10/2011    Procedure: CARPAL TUNNEL RELEASE;  Surgeon: Nicki Reaper, MD;  Location: [facility];  Service: Orthopedics;  Laterality: Right;    History  Substance Use Topics  . Smoking status: Never Smoker   . Smokeless tobacco: Never Used  . Alcohol Use: No     quit 2 years ago    No family history on file.  Allergies  Allergen Reactions  . Penicillins Hives    Medication list has been reviewed and updated.  Current Outpatient Prescriptions on  File Prior to Visit  Medication Sig Dispense Refill  . aspirin EC 81 MG tablet Take 81 mg by mouth daily.      . Cholecalciferol (VITAMIN D) 2000 UNITS tablet Take 2,000 Units by mouth daily.      Marland Kitchen donepezil (ARICEPT) 10 MG tablet Take 10 mg by mouth at bedtime.       . finasteride (PROSCAR) 5 MG tablet Take 5 mg by mouth daily.        Marland Kitchen levothyroxine (SYNTHROID, LEVOTHROID) 50 MCG tablet Take 50 mcg by mouth daily.      Marland Kitchen lisinopril (PRINIVIL,ZESTRIL) 20 MG tablet Take 10 mg by mouth daily.      Marland Kitchen loratadine (CLARITIN) 10 MG tablet Take 10 mg by mouth daily.        Marland Kitchen LORazepam (ATIVAN) 0.5 MG tablet Take 0.5 mg by mouth every 8 (eight) hours as needed. For anxiety.      . memantine (NAMENDA) 10 MG tablet Take 10 mg by mouth daily.      . sertraline (ZOLOFT) 50 MG tablet Take 50 mg by mouth daily.      . simvastatin (ZOCOR) 80 MG tablet Take 40 mg by mouth at bedtime.      . Tamsulosin HCl (FLOMAX)  0.4 MG CAPS Take 0.4 mg by mouth daily.      Marland Kitchen terazosin (HYTRIN) 2 MG capsule Take 2 mg by mouth at bedtime.        Review of Systems:  As per HPI, otherwise negative.    Physical Examination: Filed Vitals:   10/30/11 1548  BP: 100/64  Pulse: 81  Temp: 98.4 F (36.9 C)  Resp: 20   There were no vitals filed for this visit. There is no height or weight on file to calculate BMI. Ideal Body Weight:     GEN: WDWN, NAD, Non-toxic, Alert & Oriented x 3 HEENT: Atraumatic, Normocephalic.  Ears and Nose: No external deformity. EXTR: No clubbing/cyanosis/edema.  Tender and guards left shoulder, elbow, and wrist.  Guards supination and pronation of hand due pain in forearm. NEURO: Normal gait.  PSYCH: Normally interactive. Conversant. Not depressed or anxious appearing.  Calm demeanor.    Assessment and Plan: Shoulder, wrist and elbow pain Questionable elbow xray Long arm splint Sling Follow up with his ortho surgeon  [facility] reading (PRIMARY) by  Dr. Dareen Piano.  Wrist shoulder, and  forearm unremarkable.  Elbow suspicious on anterior rim of ulna articulation with humerus .    Carmelina Dane, MD

## 2011-11-01 ENCOUNTER — Telehealth: Payer: Self-pay

## 2011-11-01 NOTE — Telephone Encounter (Signed)
Pt wife calling and pt is needing a disc of pt elbow done on xray 10-30-11 pt has about first thing in the am and would like to pick them up to take to [facility].Marland Kitchen

## 2011-11-02 NOTE — Telephone Encounter (Signed)
lmom that xray is in the process of being copied. Spoke with rodi--she will take care of this.

## 2011-11-09 ENCOUNTER — Observation Stay (HOSPITAL_COMMUNITY)
Admission: EM | Admit: 2011-11-09 | Discharge: 2011-11-14 | Disposition: A | Discharge status: Skilled Nursing Facility | Payer: Medicare Other | Attending: Internal Medicine | Admitting: Internal Medicine

## 2011-11-09 ENCOUNTER — Emergency Department (HOSPITAL_COMMUNITY): Payer: Medicare Other

## 2011-11-09 DIAGNOSIS — I359 Nonrheumatic aortic valve disorder, unspecified: Secondary | ICD-10-CM | POA: Insufficient documentation

## 2011-11-09 DIAGNOSIS — G9349 Other encephalopathy: Principal | ICD-10-CM | POA: Insufficient documentation

## 2011-11-09 DIAGNOSIS — S72309A Unspecified fracture of shaft of unspecified femur, initial encounter for closed fracture: Secondary | ICD-10-CM

## 2011-11-09 DIAGNOSIS — G319 Degenerative disease of nervous system, unspecified: Secondary | ICD-10-CM | POA: Insufficient documentation

## 2011-11-09 DIAGNOSIS — N400 Enlarged prostate without lower urinary tract symptoms: Secondary | ICD-10-CM | POA: Diagnosis present

## 2011-11-09 DIAGNOSIS — Z79899 Other long term (current) drug therapy: Secondary | ICD-10-CM | POA: Insufficient documentation

## 2011-11-09 DIAGNOSIS — J209 Acute bronchitis, unspecified: Secondary | ICD-10-CM | POA: Insufficient documentation

## 2011-11-09 DIAGNOSIS — E039 Hypothyroidism, unspecified: Secondary | ICD-10-CM | POA: Insufficient documentation

## 2011-11-09 DIAGNOSIS — E7800 Pure hypercholesterolemia, unspecified: Secondary | ICD-10-CM | POA: Insufficient documentation

## 2011-11-09 DIAGNOSIS — R05 Cough: Secondary | ICD-10-CM | POA: Insufficient documentation

## 2011-11-09 DIAGNOSIS — M19019 Primary osteoarthritis, unspecified shoulder: Secondary | ICD-10-CM | POA: Insufficient documentation

## 2011-11-09 DIAGNOSIS — R059 Cough, unspecified: Secondary | ICD-10-CM | POA: Insufficient documentation

## 2011-11-09 DIAGNOSIS — I10 Essential (primary) hypertension: Secondary | ICD-10-CM | POA: Diagnosis present

## 2011-11-09 DIAGNOSIS — G9340 Encephalopathy, unspecified: Secondary | ICD-10-CM

## 2011-11-09 DIAGNOSIS — R51 Headache: Secondary | ICD-10-CM | POA: Insufficient documentation

## 2011-11-09 DIAGNOSIS — I079 Rheumatic tricuspid valve disease, unspecified: Secondary | ICD-10-CM | POA: Insufficient documentation

## 2011-11-09 DIAGNOSIS — R531 Weakness: Secondary | ICD-10-CM

## 2011-11-09 DIAGNOSIS — Z9181 History of falling: Secondary | ICD-10-CM | POA: Insufficient documentation

## 2011-11-09 DIAGNOSIS — S72009D Fracture of unspecified part of neck of unspecified femur, subsequent encounter for closed fracture with routine healing: Secondary | ICD-10-CM | POA: Insufficient documentation

## 2011-11-09 DIAGNOSIS — M25469 Effusion, unspecified knee: Secondary | ICD-10-CM | POA: Insufficient documentation

## 2011-11-09 DIAGNOSIS — W19XXXA Unspecified fall, initial encounter: Secondary | ICD-10-CM

## 2011-11-09 DIAGNOSIS — F0390 Unspecified dementia without behavioral disturbance: Secondary | ICD-10-CM | POA: Diagnosis present

## 2011-11-09 DIAGNOSIS — R5381 Other malaise: Secondary | ICD-10-CM

## 2011-11-09 DIAGNOSIS — S0100XA Unspecified open wound of scalp, initial encounter: Secondary | ICD-10-CM | POA: Insufficient documentation

## 2011-11-09 DIAGNOSIS — Z96649 Presence of unspecified artificial hip joint: Secondary | ICD-10-CM | POA: Insufficient documentation

## 2011-11-09 DIAGNOSIS — S0191XA Laceration without foreign body of unspecified part of head, initial encounter: Secondary | ICD-10-CM

## 2011-11-09 DIAGNOSIS — I6529 Occlusion and stenosis of unspecified carotid artery: Secondary | ICD-10-CM | POA: Insufficient documentation

## 2011-11-09 DIAGNOSIS — R296 Repeated falls: Secondary | ICD-10-CM | POA: Insufficient documentation

## 2011-11-09 DIAGNOSIS — S0101XA Laceration without foreign body of scalp, initial encounter: Secondary | ICD-10-CM

## 2011-11-09 DIAGNOSIS — R55 Syncope and collapse: Secondary | ICD-10-CM | POA: Insufficient documentation

## 2011-11-09 LAB — CBC WITH DIFFERENTIAL/PLATELET
Basophils Absolute: 0 10*3/uL (ref 0.0–0.1)
Basophils Relative: 0 % (ref 0–1)
Eosinophils Absolute: 0.1 10*3/uL (ref 0.0–0.7)
Eosinophils Relative: 0 % (ref 0–5)
HCT: 30.8 % — ABNORMAL LOW (ref 39.0–52.0)
Hemoglobin: 9.9 g/dL — ABNORMAL LOW (ref 13.0–17.0)
Lymphocytes Relative: 6 % — ABNORMAL LOW (ref 12–46)
Lymphs Abs: 0.8 10*3/uL (ref 0.7–4.0)
MCH: 30.9 pg (ref 26.0–34.0)
MCHC: 32.1 g/dL (ref 30.0–36.0)
MCV: 96.3 fL (ref 78.0–100.0)
Monocytes Absolute: 0.8 10*3/uL (ref 0.1–1.0)
Monocytes Relative: 7 % (ref 3–12)
Neutro Abs: 11.1 10*3/uL — ABNORMAL HIGH (ref 1.7–7.7)
Neutrophils Relative %: 87 % — ABNORMAL HIGH (ref 43–77)
Platelets: 328 10*3/uL (ref 150–400)
RBC: 3.2 MIL/uL — ABNORMAL LOW (ref 4.22–5.81)
RDW: 13.5 % (ref 11.5–15.5)
WBC: 12.8 10*3/uL — ABNORMAL HIGH (ref 4.0–10.5)

## 2011-11-09 LAB — URINALYSIS, ROUTINE W REFLEX MICROSCOPIC
Glucose, UA: NEGATIVE mg/dL
Hgb urine dipstick: NEGATIVE
Ketones, ur: NEGATIVE mg/dL
Nitrite: NEGATIVE
Protein, ur: NEGATIVE mg/dL
Specific Gravity, Urine: 1.026 (ref 1.005–1.030)
Urobilinogen, UA: 1 mg/dL (ref 0.0–1.0)
pH: 6 (ref 5.0–8.0)

## 2011-11-09 LAB — BASIC METABOLIC PANEL
BUN: 23 mg/dL (ref 6–23)
CO2: 29 mEq/L (ref 19–32)
Calcium: 9.1 mg/dL (ref 8.4–10.5)
Chloride: 101 mEq/L (ref 96–112)
Creatinine, Ser: 1.05 mg/dL (ref 0.50–1.35)
GFR calc Af Amer: 72 mL/min — ABNORMAL LOW (ref >90)
GFR calc non Af Amer: 62 mL/min — ABNORMAL LOW (ref >90)
Glucose, Bld: 189 mg/dL — ABNORMAL HIGH (ref 70–99)
Potassium: 4 mEq/L (ref 3.5–5.1)
Sodium: 138 mEq/L (ref 135–145)

## 2011-11-09 LAB — URINE MICROSCOPIC-ADD ON

## 2011-11-09 MED ORDER — MORPHINE SULFATE 2 MG/ML IJ SOLN
2.0000 mg | Freq: Once | INTRAMUSCULAR | Status: AC
  Administered 2011-11-09: 2 mg via INTRAVENOUS
  Filled 2011-11-09: qty 1

## 2011-11-09 NOTE — ED Provider Notes (Signed)
History     CSN: [id number]  Arrival date & time 11/09/11  1730   First MD Initiated Contact with Patient 11/09/11 1754      Chief Complaint  Patient presents with  . Fall    (Consider location/radiation/quality/duration/timing/severity/associated sxs/prior treatment) HPI Patient presents emergency department, following a fall just prior to arrival.  Patient's wife, states she heard him fall in the bathroom and found him laying in the tub.  Patient had a laceration to the left scalp and wife called EMS.  Patient has dementia and is lethargic on exam, and is unable to give me much history.  Patient, family, states he does seem more lethargic than normal and is asked if he was given something prior to arrival.  Family states, that there is no vomiting, fever, diarrhea, incontinence, seizure activity or shortness of breath.  Past Medical History  Diagnosis Date  . Hypertension   . Hypercholesterolemia   . DEMENTIA   . Arthritis   . Hypothyroidism   . Neuromuscular disorder     carpal tunnel    Past Surgical History  Procedure Date  . Joint replacement   . Eye surgery     lasik both eyes  . Transurethral resection of prostate [id number]  . Shoulder arthroscopy 2005    right  . Shoulder arthroscopy 2002    rt  . Total hip arthroplasty     right and left  . Cholecystectomy   . Colonoscopy   . Carpal tunnel release 05/10/2011    Procedure: CARPAL TUNNEL RELEASE;  Surgeon: Nicki Reaper, MD;  Location: [facility];  Service: Orthopedics;  Laterality: Right;    No family history on file.  History  Substance Use Topics  . Smoking status: Never Smoker   . Smokeless tobacco: Never Used  . Alcohol Use: No     quit 2 years ago      Review of Systems All other systems negative except as documented in the HPI. All pertinent positives and negatives as reviewed in the HPI.  Allergies  Penicillins  Home Medications   Current Outpatient Rx  Name Route Sig  Dispense Refill  . ASPIRIN EC 81 MG PO TBEC Oral Take 81 mg by mouth daily.    Marland Kitchen VITAMIN D 2000 UNITS PO TABS Oral Take 2,000 Units by mouth daily.    . DONEPEZIL HCL 10 MG PO TABS Oral Take 10 mg by mouth at bedtime.     Marland Kitchen FINASTERIDE 5 MG PO TABS Oral Take 5 mg by mouth daily.      Marland Kitchen LEVOTHYROXINE SODIUM 50 MCG PO TABS Oral Take 50 mcg by mouth daily.    Marland Kitchen LISINOPRIL 20 MG PO TABS Oral Take 10 mg by mouth daily.    Marland Kitchen LORATADINE 10 MG PO TABS Oral Take 10 mg by mouth daily.      Marland Kitchen LORAZEPAM 0.5 MG PO TABS Oral Take 0.5 mg by mouth every 8 (eight) hours as needed. For anxiety.    Marland Kitchen MEMANTINE HCL 10 MG PO TABS Oral Take 10 mg by mouth daily.    . SERTRALINE HCL 50 MG PO TABS Oral Take 50 mg by mouth daily.    Marland Kitchen SIMVASTATIN 80 MG PO TABS Oral Take 40 mg by mouth at bedtime.    . TAMSULOSIN HCL 0.4 MG PO CAPS Oral Take 0.4 mg by mouth daily.    Marland Kitchen TERAZOSIN HCL 2 MG PO CAPS Oral Take 2 mg by mouth at bedtime.  BP 103/50  Pulse 75  Temp 97.9 F (36.6 C) (Oral)  Resp 16  SpO2 100%  Physical Exam  Nursing note and vitals reviewed. Constitutional: He appears well-developed and well-nourished. He appears lethargic. No distress.  HENT:  Head: Normocephalic. Head is with contusion and with laceration.  Right Ear: No hemotympanum.  Left Ear: No hemotympanum.  Nose: Nose normal.  Mouth/Throat: Uvula is midline and oropharynx is clear and moist.  Eyes: EOM are normal. Right pupil is not reactive. Left pupil is not reactive.  Neck: Normal range of motion. Neck supple.  Cardiovascular: Normal rate, regular rhythm and normal heart sounds.  Exam reveals no gallop and no friction rub.   No murmur heard. Pulmonary/Chest: Effort normal and breath sounds normal. No respiratory distress.  Musculoskeletal:       Right shoulder: He exhibits tenderness and pain. He exhibits no deformity.       Cervical back: He exhibits no deformity.       Thoracic back: Normal.       Lumbar back: Normal.    Neurological: He appears lethargic. No sensory deficit. He exhibits normal muscle tone.  Skin: Skin is warm and dry.    ED Course  Procedures (including critical care time)   Labs Reviewed  CBC WITH DIFFERENTIAL  BASIC METABOLIC PANEL  URINALYSIS, ROUTINE W REFLEX MICROSCOPIC   Dg Pelvis 1-2 Views  11/09/2011  *RADIOLOGY REPORT*  Clinical Data: Fall.  Bilateral hip pain.  PELVIS - 1-2 VIEW  Comparison: None.  Findings: Bilateral total hip arthroplasties are in place.  Both devices are located and there is no fracture.  Asymmetric positioning of the femoral heads in the acetabular cup is consistent with wear of the linings of the cups.  IMPRESSION:  1.  No acute finding. 2.  Findings compatible with wear of the lining of the patient's acetabular cups.   Original Report Authenticated By: Bernadene Bell. Maricela Curet, M.D.    Dg Shoulder Right  11/09/2011  *RADIOLOGY REPORT*  Clinical Data: Fall, pain.  RIGHT SHOULDER - 2+ VIEW  Comparison: Plain films 09/04/2011.  Findings: There is no fracture or dislocation.  Postoperative change noted.  Glenohumeral degenerative disease is present. Imaged right lung and ribs are unremarkable.  IMPRESSION: No acute finding.   Original Report Authenticated By: Bernadene Bell. Maricela Curet, M.D.    Ct Head Wo Contrast  11/09/2011  *RADIOLOGY REPORT*  Clinical Data:  Status post fall.  Pain is.  Right.  No free pain.  CT HEAD WITHOUT CONTRAST CT CERVICAL SPINE WITHOUT CONTRAST  Technique:  Multidetector CT imaging of the head and cervical spine was performed following the standard protocol without intravenous contrast.  Multiplanar CT image reconstructions of the cervical spine were also generated.  Comparison:  Head and cervical spine CT scan 10/26/2011.  CT HEAD  Findings: Marked cortical atrophy and extensive chronic microvascular ischemic change are again seen.  There is a scalp contusion over the left parietal bone, new since the prior study. No evidence of acute intracranial  abnormality including infarction, hemorrhage, mass lesion, mass effect, midline shift or abnormal extra-axial fluid collection.  No hydrocephalus or pneumocephalus. Calvarium intact.  IMPRESSION:  1.  Scalp contusion over the left parietal bone.  No underlying fracture or acute intracranial abnormality. 2.  Marked atrophy and chronic microvascular ischemic change.  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  There is no fracture or subluxation of the cervical spine. Marked multilevel degenerative  disease is unchanged in appearance. Lung apices are clear.  IMPRESSION: No acute finding.  Marked multilevel degenerative disease.   Original Report Authenticated By: Bernadene Bell. Maricela Curet, M.D.    Ct Cervical Spine Wo Contrast  11/09/2011  *RADIOLOGY REPORT*  Clinical Data:  Status post fall.  Pain is.  Right.  No free pain.  CT HEAD WITHOUT CONTRAST CT CERVICAL SPINE WITHOUT CONTRAST  Technique:  Multidetector CT imaging of the head and cervical spine was performed following the standard protocol without intravenous contrast.  Multiplanar CT image reconstructions of the cervical spine were also generated.  Comparison:  Head and cervical spine CT scan 10/26/2011.  CT HEAD  Findings: Marked cortical atrophy and extensive chronic microvascular ischemic change are again seen.  There is a scalp contusion over the left parietal bone, new since the prior study. No evidence of acute intracranial abnormality including infarction, hemorrhage, mass lesion, mass effect, midline shift or abnormal extra-axial fluid collection.  No hydrocephalus or pneumocephalus. Calvarium intact.  IMPRESSION:  1.  Scalp contusion over the left parietal bone.  No underlying fracture or acute intracranial abnormality. 2.  Marked atrophy and chronic microvascular ischemic change.  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  There is no fracture or  subluxation of the cervical spine. Marked multilevel degenerative disease is unchanged in appearance. Lung apices are clear.  IMPRESSION: No acute finding.  Marked multilevel degenerative disease.   Original Report Authenticated By: Bernadene Bell. Maricela Curet, M.D.     Patient be worked up for syncope, as well.  CT scan head and neck were negative along with his x-rays.  Patient will need repair of the lacerations to scalp.    MDM  MDM Reviewed: vitals and nursing note Interpretation: labs, ECG, x-ray and CT scan   LACERATION REPAIR Performed by: Carlyle Dolly Authorized by: Carlyle Dolly Consent: Verbal consent obtained. Risks and benefits: risks, benefits and alternatives were discussed Consent given by: patient Patient identity confirmed: provided demographic data Prepped and Draped in normal sterile fashion Wound explored  Laceration Location: L scalp x2  Laceration Length: 5cm lac 1  6 cm lac 2  No Foreign Bodies seen or palpated  Anesthesia: local infiltration  Local anesthetic:none  Anesthetic total: NA  Irrigation method: syringe Amount of cleaning: standard  Skin closure: Staples   Number of sutures: Lac 1- 5 Lac 2- 6  Technique: Staples  Patient tolerance: Patient tolerated the procedure well with no immediate complications.         Carlyle Dolly, PA-C 11/09/11 2115

## 2011-11-09 NOTE — ED Notes (Signed)
[id number]<AV> Expected date:<BR> Expected time:<BR> Means of arrival:<BR> Comments:<BR> Hold for head inj-- fell  Forward-- 2 lacerations to forehead.

## 2011-11-09 NOTE — ED Provider Notes (Signed)
Re-evaluation:  Per family, patient not at baseline - remains confused, somewhat lethargic. Labs are unremarkable, x-rays negative. Discussed admission vs. discharge and given his continued confusion, will consult [facility] for admission.  Rodena Medin, PA-C 11/09/11 2245

## 2011-11-09 NOTE — ED Notes (Signed)
Attempted to assess the pt's pain. Pt is talkative but is unable to answer my question.  Pt seems to be confused but unsure if this is d/t pt's dementia.

## 2011-11-09 NOTE — ED Notes (Signed)
Pt returned from X-ray.  

## 2011-11-09 NOTE — ED Provider Notes (Signed)
Medical screening examination/treatment/procedure(s) were conducted as a shared visit with non-physician practitioner(s) and myself.  I personally evaluated the patient during the encounter  Pt with recurrent falls.  Laceration noted on scalp.  ?syncope and LOC.  No hemorrhage noted on CT.  Will admit for further workup.  Would consider MRI rule out occult cva  Dg Pelvis 1-2 Views  11/09/2011  *RADIOLOGY REPORT*  Clinical Data: Fall.  Bilateral hip pain.  PELVIS - 1-2 VIEW  Comparison: None.  Findings: Bilateral total hip arthroplasties are in place.  Both devices are located and there is no fracture.  Asymmetric positioning of the femoral heads in the acetabular cup is consistent with wear of the linings of the cups.  IMPRESSION:  1.  No acute finding. 2.  Findings compatible with wear of the lining of the patient's acetabular cups.   Original Report Authenticated By: Bernadene Bell. Maricela Curet, M.D.    Dg Shoulder Right  11/09/2011  *RADIOLOGY REPORT*  Clinical Data: Fall, pain.  RIGHT SHOULDER - 2+ VIEW  Comparison: Plain films 09/04/2011.  Findings: There is no fracture or dislocation.  Postoperative change noted.  Glenohumeral degenerative disease is present. Imaged right lung and ribs are unremarkable.  IMPRESSION: No acute finding.   Original Report Authenticated By: Bernadene Bell. Maricela Curet, M.D.    Dg Femur Right  11/09/2011  *RADIOLOGY REPORT*  Clinical Data: Fall.  Pain.  RIGHT FEMUR - 2 VIEW  Comparison: Plain films right hip 09/03/2011.  Findings: Right total hip replacement is in place.  The device is located.  Periprosthetic fracture along the lateral margin of the proximal femur shows progressive healing.  No new fracture is identified.  There is a small knee joint effusion with degenerative change about the knee.  IMPRESSION:  1.  No acute finding. 2.  Healing incomplete periprosthetic fracture proximal right femur.   Original Report Authenticated By: Bernadene Bell. Maricela Curet, M.D.    Ct Head Wo  Contrast  11/09/2011  *RADIOLOGY REPORT*  Clinical Data:  Status post fall.  Pain is.  Right.  No free pain.  CT HEAD WITHOUT CONTRAST CT CERVICAL SPINE WITHOUT CONTRAST  Technique:  Multidetector CT imaging of the head and cervical spine was performed following the standard protocol without intravenous contrast.  Multiplanar CT image reconstructions of the cervical spine were also generated.  Comparison:  Head and cervical spine CT scan 10/26/2011.  CT HEAD  Findings: Marked cortical atrophy and extensive chronic microvascular ischemic change are again seen.  There is a scalp contusion over the left parietal bone, new since the prior study. No evidence of acute intracranial abnormality including infarction, hemorrhage, mass lesion, mass effect, midline shift or abnormal extra-axial fluid collection.  No hydrocephalus or pneumocephalus. Calvarium intact.  IMPRESSION:  1.  Scalp contusion over the left parietal bone.  No underlying fracture or acute intracranial abnormality. 2.  Marked atrophy and chronic microvascular ischemic change.  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  There is no fracture or subluxation of the cervical spine. Marked multilevel degenerative disease is unchanged in appearance. Lung apices are clear.  IMPRESSION: No acute finding.  Marked multilevel degenerative disease.   Original Report Authenticated By: Bernadene Bell. Maricela Curet, M.D.    Ct Cervical Spine Wo Contrast  11/09/2011  *RADIOLOGY REPORT*  Clinical Data:  Status post fall.  Pain is.  Right.  No free pain.  CT HEAD WITHOUT CONTRAST CT CERVICAL SPINE WITHOUT CONTRAST  Technique:  Multidetector CT imaging of the head and cervical spine was performed following the standard protocol without intravenous contrast.  Multiplanar CT image reconstructions of the cervical spine were also generated.  Comparison:  Head and cervical spine CT scan 10/26/2011.  CT HEAD  Findings: Marked cortical  atrophy and extensive chronic microvascular ischemic change are again seen.  There is a scalp contusion over the left parietal bone, new since the prior study. No evidence of acute intracranial abnormality including infarction, hemorrhage, mass lesion, mass effect, midline shift or abnormal extra-axial fluid collection.  No hydrocephalus or pneumocephalus. Calvarium intact.  IMPRESSION:  1.  Scalp contusion over the left parietal bone.  No underlying fracture or acute intracranial abnormality. 2.  Marked atrophy and chronic microvascular ischemic change.  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  There is no fracture or subluxation of the cervical spine. Marked multilevel degenerative disease is unchanged in appearance. Lung apices are clear.  IMPRESSION: No acute finding.  Marked multilevel degenerative disease.   Original Report Authenticated By: Bernadene Bell. Maricela Curet, M.D.      Celene Kras, MD 11/09/11 2212

## 2011-11-09 NOTE — ED Notes (Signed)
Pt was walking toward the bathroom, fell forward into bathtub, possible LOC. Pt has 2 lacerations to left side of head, no anticoagulation, c/o bilateral shoulder pain. Denies neck/back pain. Immobilized. 12-Lead WNL. Hx of dementia; is at baseline per family on scene.

## 2011-11-09 NOTE — ED Notes (Signed)
Hospitalist in room with family and pt at this time

## 2011-11-10 ENCOUNTER — Observation Stay (HOSPITAL_COMMUNITY): Payer: Medicare Other

## 2011-11-10 ENCOUNTER — Encounter (HOSPITAL_COMMUNITY): Payer: Self-pay | Admitting: *Deleted

## 2011-11-10 DIAGNOSIS — W19XXXA Unspecified fall, initial encounter: Secondary | ICD-10-CM

## 2011-11-10 DIAGNOSIS — F0390 Unspecified dementia without behavioral disturbance: Secondary | ICD-10-CM

## 2011-11-10 DIAGNOSIS — R55 Syncope and collapse: Secondary | ICD-10-CM

## 2011-11-10 DIAGNOSIS — I359 Nonrheumatic aortic valve disorder, unspecified: Secondary | ICD-10-CM

## 2011-11-10 DIAGNOSIS — S0191XA Laceration without foreign body of unspecified part of head, initial encounter: Secondary | ICD-10-CM

## 2011-11-10 DIAGNOSIS — G9340 Encephalopathy, unspecified: Secondary | ICD-10-CM | POA: Diagnosis present

## 2011-11-10 DIAGNOSIS — R5381 Other malaise: Secondary | ICD-10-CM

## 2011-11-10 LAB — CBC
HCT: 28.5 % — ABNORMAL LOW (ref 39.0–52.0)
Hemoglobin: 9.7 g/dL — ABNORMAL LOW (ref 13.0–17.0)
MCHC: 34 g/dL (ref 30.0–36.0)
MCV: 94.4 fL (ref 78.0–100.0)
RDW: 13.3 % (ref 11.5–15.5)
WBC: 11.8 10*3/uL — ABNORMAL HIGH (ref 4.0–10.5)

## 2011-11-10 LAB — BASIC METABOLIC PANEL
BUN: 19 mg/dL (ref 6–23)
CO2: 28 mEq/L (ref 19–32)
Chloride: 105 mEq/L (ref 96–112)
Creatinine, Ser: 0.96 mg/dL (ref 0.50–1.35)
GFR calc Af Amer: 84 mL/min — ABNORMAL LOW (ref >90)
Potassium: 3.3 mEq/L — ABNORMAL LOW (ref 3.5–5.1)

## 2011-11-10 MED ORDER — ACETAMINOPHEN 325 MG PO TABS: 650.0000 mg | ORAL_TABLET | Freq: Four times a day (QID) | ORAL | Status: DC | PRN

## 2011-11-10 MED ORDER — ONDANSETRON HCL 4 MG PO TABS: 4.0000 mg | ORAL_TABLET | Freq: Four times a day (QID) | ORAL | Status: DC | PRN

## 2011-11-10 MED ORDER — DONEPEZIL HCL 10 MG PO TABS
10.0000 mg | ORAL_TABLET | Freq: Every day | ORAL | Status: DC
  Administered 2011-11-10 – 2011-11-13 (×5): 10 mg via ORAL
  Filled 2011-11-10 (×6): qty 1

## 2011-11-10 MED ORDER — MEMANTINE HCL 10 MG PO TABS
10.0000 mg | ORAL_TABLET | Freq: Every day | ORAL | Status: DC
  Administered 2011-11-10 – 2011-11-14 (×5): 10 mg via ORAL
  Filled 2011-11-10 (×5): qty 1

## 2011-11-10 MED ORDER — AZITHROMYCIN 500 MG PO TABS
500.0000 mg | ORAL_TABLET | Freq: Every day | ORAL | Status: DC
  Administered 2011-11-10 – 2011-11-14 (×5): 500 mg via ORAL
  Filled 2011-11-10 (×5): qty 1

## 2011-11-10 MED ORDER — ACETAMINOPHEN 650 MG RE SUPP: 650.0000 mg | Freq: Four times a day (QID) | RECTAL | Status: DC | PRN

## 2011-11-10 MED ORDER — ALUM & MAG HYDROXIDE-SIMETH 200-200-20 MG/5ML PO SUSP: 30.0000 mL | Freq: Four times a day (QID) | ORAL | Status: DC | PRN

## 2011-11-10 MED ORDER — LORATADINE 10 MG PO TABS
10.0000 mg | ORAL_TABLET | Freq: Every day | ORAL | Status: DC
  Administered 2011-11-10 – 2011-11-14 (×5): 10 mg via ORAL
  Filled 2011-11-10 (×5): qty 1

## 2011-11-10 MED ORDER — MORPHINE SULFATE 2 MG/ML IJ SOLN: 1.0000 mg | INTRAMUSCULAR | Status: DC | PRN

## 2011-11-10 MED ORDER — TAMSULOSIN HCL 0.4 MG PO CAPS
0.4000 mg | ORAL_CAPSULE | Freq: Every day | ORAL | Status: DC
  Administered 2011-11-10 – 2011-11-14 (×5): 0.4 mg via ORAL
  Filled 2011-11-10 (×5): qty 1

## 2011-11-10 MED ORDER — LISINOPRIL 10 MG PO TABS
10.0000 mg | ORAL_TABLET | Freq: Every day | ORAL | Status: DC
  Administered 2011-11-10: 10 mg via ORAL
  Filled 2011-11-10 (×2): qty 1

## 2011-11-10 MED ORDER — SERTRALINE HCL 50 MG PO TABS
50.0000 mg | ORAL_TABLET | Freq: Every day | ORAL | Status: DC
  Administered 2011-11-10 – 2011-11-14 (×5): 50 mg via ORAL
  Filled 2011-11-10 (×5): qty 1

## 2011-11-10 MED ORDER — FINASTERIDE 5 MG PO TABS
5.0000 mg | ORAL_TABLET | Freq: Every day | ORAL | Status: DC
  Administered 2011-11-10 – 2011-11-14 (×5): 5 mg via ORAL
  Filled 2011-11-10 (×5): qty 1

## 2011-11-10 MED ORDER — LEVOTHYROXINE SODIUM 50 MCG PO TABS
50.0000 ug | ORAL_TABLET | Freq: Every day | ORAL | Status: DC
  Administered 2011-11-10 – 2011-11-14 (×5): 50 ug via ORAL
  Filled 2011-11-10 (×6): qty 1

## 2011-11-10 MED ORDER — ASPIRIN EC 81 MG PO TBEC
81.0000 mg | DELAYED_RELEASE_TABLET | Freq: Every day | ORAL | Status: DC
  Administered 2011-11-10 – 2011-11-14 (×5): 81 mg via ORAL
  Filled 2011-11-10 (×5): qty 1

## 2011-11-10 MED ORDER — SENNOSIDES-DOCUSATE SODIUM 8.6-50 MG PO TABS
1.0000 | ORAL_TABLET | Freq: Every evening | ORAL | Status: DC | PRN
  Filled 2011-11-10: qty 1

## 2011-11-10 MED ORDER — TERAZOSIN HCL 2 MG PO CAPS
2.0000 mg | ORAL_CAPSULE | Freq: Every day | ORAL | Status: DC
  Administered 2011-11-10 – 2011-11-11 (×3): 2 mg via ORAL
  Filled 2011-11-10 (×4): qty 1

## 2011-11-10 MED ORDER — POTASSIUM CHLORIDE CRYS ER 20 MEQ PO TBCR
40.0000 meq | EXTENDED_RELEASE_TABLET | Freq: Once | ORAL | Status: AC
  Administered 2011-11-11: 40 meq via ORAL
  Filled 2011-11-10: qty 2

## 2011-11-10 MED ORDER — HYDROCODONE-ACETAMINOPHEN 5-325 MG PO TABS: 1.0000 | ORAL_TABLET | ORAL | Status: DC | PRN

## 2011-11-10 MED ORDER — ONDANSETRON HCL 4 MG/2ML IJ SOLN: 4.0000 mg | Freq: Four times a day (QID) | INTRAMUSCULAR | Status: DC | PRN

## 2011-11-10 NOTE — Progress Notes (Addendum)
[facility] PROGRESS NOTE  Robert Preston VHQ:[id number] DOB: 1923/02/11 DOA: 11/09/2011 PCP: Nadean Corwin, MD  Assessment/Plan: Active Problems:  HTN (hypertension), benign  Dementia  BPH (benign prostatic hyperplasia)  Encephalopathy acute  Fall  Laceration of head  Debility  1. Recurrent falls,Multifactorial patient has a head laceration that was sutured in the ER. According to the family the patient is fallen  9 times this year. He had a periprosthetic fracture, and is still healing from that. He was in acute rehabilitation for 6 weeks and was discharged 2 weeks ago. 2. Recent episode of acute bronchitis we'll do a chest x-ray, resume Z-Pak 3. Recurrent syncope full workup including a 2-D echo, carotid Doppler, MRI will be done however the patient has staples for his head laceration and may not be able to get an MRI   Status: full Family Communication: family updated about patient's clinical progress Disposition Plan:  As above    Brief narrative: 78 year old gentleman who lives at home, he was attempting to go to the restroom with his walker, when he had a mechanical fall and fell onto the tub. It was an unwitnessed fall, his wife was in the next room. He didn't hit his head and sustained a laceration   Consultants:   none  Procedures:   none  Antibiotics:   start azithromycin  HPI/Subjective:  feeling better today   Objective: Filed Vitals:   11/09/11 1737 11/09/11 2343 11/10/11 0047 11/10/11 0556  BP: 103/50 147/64 168/65 110/57  Pulse: 75 79 74 95  Temp: 97.9 F (36.6 C) 97.9 F (36.6 C) 98.6 F (37 C) 99.6 F (37.6 C)  TempSrc: Oral Oral Oral Oral  Resp: 16 16 18 20   Height:    6\' 2"  (1.88 m)  Weight:    78.6 kg (173 lb 4.5 oz)  SpO2: 100% 98% 100% 96%    Intake/Output Summary (Last 24 hours) at 11/10/11 1441 Last data filed at 11/10/11 0200  Gross per 24 hour  Intake    200 ml  Output      0 ml  Net    200 ml     Exam:  HENT:  Head: Atraumatic.  Nose: Nose normal.  Mouth/Throat: Oropharynx is clear and moist.  Eyes: Conjunctivae are normal. Pupils are equal, round, and reactive to light. No scleral icterus.  Neck: Neck supple. No tracheal deviation present.  Cardiovascular: Normal rate, regular rhythm, normal heart sounds and intact distal pulses.  Pulmonary/Chest: Effort normal and breath sounds normal. No respiratory distress.  Abdominal: Soft. Normal appearance and bowel sounds are normal. She exhibits no distension. There is no tenderness.  Musculoskeletal: She exhibits no edema and no tenderness.  Neurological: She is alert. No cranial nerve deficit.    Data Reviewed: Basic Metabolic Panel:  Lab 11/10/11 4132 11/09/11 1954  NA 141 138  K 3.3* 4.0  CL 105 101  CO2 28 29  GLUCOSE 101* 189*  BUN 19 23  CREATININE 0.96 1.05  CALCIUM 9.1 9.1  MG -- --  PHOS -- --    Liver Function Tests: No results found for this basename: AST:5,ALT:5,ALKPHOS:5,BILITOT:5,PROT:5,ALBUMIN:5 in the last 168 hours No results found for this basename: LIPASE:5,AMYLASE:5 in the last 168 hours No results found for this basename: AMMONIA:5 in the last 168 hours  CBC:  Lab 11/10/11 0420 11/09/11 1954  WBC 11.8* 12.8*  NEUTROABS -- 11.1*  HGB 9.7* 9.9*  HCT 28.5* 30.8*  MCV 94.4 96.3  PLT 332 328  Cardiac Enzymes: No results found for this basename: CKTOTAL:5,CKMB:5,CKMBINDEX:5,TROPONINI:5 in the last 168 hours BNP (last 3 results) No results found for this basename: PROBNP:3 in the last 8760 hours   CBG: No results found for this basename: GLUCAP:5 in the last 168 hours  No results found for this or any previous visit (from the past 240 hour(s)).   Studies: Dg Pelvis 1-2 Views  11/09/2011  *RADIOLOGY REPORT*  Clinical Data: Fall.  Bilateral hip pain.  PELVIS - 1-2 VIEW  Comparison: None.  Findings: Bilateral total hip arthroplasties are in place.  Both devices are located and there is  no fracture.  Asymmetric positioning of the femoral heads in the acetabular cup is consistent with wear of the linings of the cups.  IMPRESSION:  1.  No acute finding. 2.  Findings compatible with wear of the lining of the patient's acetabular cups.   Original Report Authenticated By: Bernadene Bell. Maricela Curet, M.D.    Dg Shoulder Right  11/09/2011  *RADIOLOGY REPORT*  Clinical Data: Fall, pain.  RIGHT SHOULDER - 2+ VIEW  Comparison: Plain films 09/04/2011.  Findings: There is no fracture or dislocation.  Postoperative change noted.  Glenohumeral degenerative disease is present. Imaged right lung and ribs are unremarkable.  IMPRESSION: No acute finding.   Original Report Authenticated By: Bernadene Bell. D'ALESSIO, M.D.    Dg Elbow Complete Left  10/30/2011  *RADIOLOGY REPORT*  Clinical Data: Recent fall  LEFT ELBOW - COMPLETE 3+ VIEW  Comparison: None  Findings: On the lateral view there is a small elbow joint effusion present somewhat displacing the anterior fat pad.  No definite radial fracture is seen.  There is however slight cortical irregularity of the coronoid process of the proximal ulna and a nondisplaced fracture is a consideration.  IMPRESSION: Small elbow effusion.  Suspect fracture possibly of the coronoid process of the proximal left ulna.  Clinically significant discrepancy from primary report, if provided: None   Original Report Authenticated By: Juline Patch, M.D.    Dg Forearm Left  10/30/2011  *RADIOLOGY REPORT*  Clinical Data: Larey Seat  LEFT FOREARM - 2 VIEW  Comparison: 02/15/2011  Findings: Fracture of the coronoid process of the ulna, distracted 1 mm and displaced distally 2- 3 mm.  Distal forearm unremarkable.  IMPRESSION:  1.  Fracture of the coronoid process of the ulna as above.  Clinically significant discrepancy from primary report, if provided: None   Original Report Authenticated By: Osa Craver, M.D.    Dg Wrist Complete Left  10/30/2011  *RADIOLOGY REPORT*  Clinical Data:  Larey Seat  LEFT WRIST - COMPLETE 3+ VIEW  Comparison: None.  Findings: Carpal rows intact. Negative for fracture, dislocation, or other acute abnormality.  Normal alignment and mineralization. No significant degenerative change.  Regional soft tissues unremarkable.  IMPRESSION:  Negative  Clinically significant discrepancy from primary report, if provided: None   Original Report Authenticated By: Thora Lance III, M.D.    Dg Hip Complete Left  10/26/2011  *RADIOLOGY REPORT*  Clinical Data: Recent fall, left hip pain  LEFT HIP - COMPLETE 2+ VIEW  Comparison: Pelvis left hip films of 02/15/2011  Findings: Bilateral total hip replacements are present.  The femoral and acetabular components appear unchanged in position on the frontal view.  The bones are diffusely osteopenic.  No acute fracture is seen.  There are degenerative changes noted in the lower lumbar spine.  IMPRESSION: Stable appearance of bilateral total hip replacements.  Diffuse osteopenia with degenerative change in  the lower lumbar spine.  Original Report Authenticated By: Juline Patch, M.D.   Dg Femur Right  11/09/2011  *RADIOLOGY REPORT*  Clinical Data: Fall.  Pain.  RIGHT FEMUR - 2 VIEW  Comparison: Plain films right hip 09/03/2011.  Findings: Right total hip replacement is in place.  The device is located.  Periprosthetic fracture along the lateral margin of the proximal femur shows progressive healing.  No new fracture is identified.  There is a small knee joint effusion with degenerative change about the knee.  IMPRESSION:  1.  No acute finding. 2.  Healing incomplete periprosthetic fracture proximal right femur.   Original Report Authenticated By: Bernadene Bell. Maricela Curet, M.D.    Ct Head Wo Contrast  11/09/2011  *RADIOLOGY REPORT*  Clinical Data:  Status post fall.  Pain is.  Right.  No free pain.  CT HEAD WITHOUT CONTRAST CT CERVICAL SPINE WITHOUT CONTRAST  Technique:  Multidetector CT imaging of the head and cervical spine was performed  following the standard protocol without intravenous contrast.  Multiplanar CT image reconstructions of the cervical spine were also generated.  Comparison:  Head and cervical spine CT scan 10/26/2011.  CT HEAD  Findings: Marked cortical atrophy and extensive chronic microvascular ischemic change are again seen.  There is a scalp contusion over the left parietal bone, new since the prior study. No evidence of acute intracranial abnormality including infarction, hemorrhage, mass lesion, mass effect, midline shift or abnormal extra-axial fluid collection.  No hydrocephalus or pneumocephalus. Calvarium intact.  IMPRESSION:  1.  Scalp contusion over the left parietal bone.  No underlying fracture or acute intracranial abnormality. 2.  Marked atrophy and chronic microvascular ischemic change.  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  There is no fracture or subluxation of the cervical spine. Marked multilevel degenerative disease is unchanged in appearance. Lung apices are clear.  IMPRESSION: No acute finding.  Marked multilevel degenerative disease.   Original Report Authenticated By: Bernadene Bell. Maricela Curet, M.D.    Ct Head Wo Contrast  10/26/2011  *RADIOLOGY REPORT*  Clinical Data:  Larey Seat and hit head.  Neck pain.  Head pain.  CT HEAD WITHOUT CONTRAST CT CERVICAL SPINE WITHOUT CONTRAST  Technique:  Multidetector CT imaging of the head and cervical spine was performed following the standard protocol without intravenous contrast.  Multiplanar CT image reconstructions of the cervical spine were also generated.  Comparison:  MRI cervical spine 04/11/2011.  CT head cervical spine 03/16/2011.  CT HEAD  Findings: There is no evidence for acute infarction, intracranial hemorrhage, mass lesion, hydrocephalus, or extra-axial fluid. Moderate to severe atrophy is present.  Advanced chronic microvascular ischemic change is present in the periventricular and subcortical white  matter.  The calvarium is intact.  There is moderate vascular calcification. There is no acute sinus or mastoid fluid.  There is a similar appearance to priors.  IMPRESSION: Atrophy and chronic microvascular ischemic change.  No skull fracture or intracranial hemorrhage.  CT CERVICAL SPINE  Findings: No visible cervical spine fracture or traumatic subluxation.  No prevertebral soft tissue swelling or visible intraspinal hematoma.  Trachea midline.  Lung apices clear.  No upper rib fracture.  Advanced disc space narrowing C3-C7. Bilateral carotid calcification.  Multilevel facet arthropathy.  IMPRESSION: No visible cervical spine fracture or traumatic subluxation. Similar appearance to priors.  Original Report Authenticated By: Elsie Stain, M.D.   Ct Cervical Spine Wo Contrast  11/09/2011  *RADIOLOGY REPORT*  Clinical  Data:  Status post fall.  Pain is.  Right.  No free pain.  CT HEAD WITHOUT CONTRAST CT CERVICAL SPINE WITHOUT CONTRAST  Technique:  Multidetector CT imaging of the head and cervical spine was performed following the standard protocol without intravenous contrast.  Multiplanar CT image reconstructions of the cervical spine were also generated.  Comparison:  Head and cervical spine CT scan 10/26/2011.  CT HEAD  Findings: Marked cortical atrophy and extensive chronic microvascular ischemic change are again seen.  There is a scalp contusion over the left parietal bone, new since the prior study. No evidence of acute intracranial abnormality including infarction, hemorrhage, mass lesion, mass effect, midline shift or abnormal extra-axial fluid collection.  No hydrocephalus or pneumocephalus. Calvarium intact.  IMPRESSION:  1.  Scalp contusion over the left parietal bone.  No underlying fracture or acute intracranial abnormality. 2.  Marked atrophy and chronic microvascular ischemic change.  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  There is no  fracture or subluxation of the cervical spine. Marked multilevel degenerative disease is unchanged in appearance. Lung apices are clear.  IMPRESSION: No acute finding.  Marked multilevel degenerative disease.   Original Report Authenticated By: Bernadene Bell. Maricela Curet, M.D.    Ct Cervical Spine Wo Contrast  10/26/2011  *RADIOLOGY REPORT*  Clinical Data:  Larey Seat and hit head.  Neck pain.  Head pain.  CT HEAD WITHOUT CONTRAST CT CERVICAL SPINE WITHOUT CONTRAST  Technique:  Multidetector CT imaging of the head and cervical spine was performed following the standard protocol without intravenous contrast.  Multiplanar CT image reconstructions of the cervical spine were also generated.  Comparison:  MRI cervical spine 04/11/2011.  CT head cervical spine 03/16/2011.  CT HEAD  Findings: There is no evidence for acute infarction, intracranial hemorrhage, mass lesion, hydrocephalus, or extra-axial fluid. Moderate to severe atrophy is present.  Advanced chronic microvascular ischemic change is present in the periventricular and subcortical white matter.  The calvarium is intact.  There is moderate vascular calcification. There is no acute sinus or mastoid fluid.  There is a similar appearance to priors.  IMPRESSION: Atrophy and chronic microvascular ischemic change.  No skull fracture or intracranial hemorrhage.  CT CERVICAL SPINE  Findings: No visible cervical spine fracture or traumatic subluxation.  No prevertebral soft tissue swelling or visible intraspinal hematoma.  Trachea midline.  Lung apices clear.  No upper rib fracture.  Advanced disc space narrowing C3-C7. Bilateral carotid calcification.  Multilevel facet arthropathy.  IMPRESSION: No visible cervical spine fracture or traumatic subluxation. Similar appearance to priors.  Original Report Authenticated By: Elsie Stain, M.D.   Dg Shoulder Left  10/30/2011  *RADIOLOGY REPORT*  Clinical Data: Larey Seat  LEFT SHOULDER - 2+ VIEW  Comparison: 03/16/2011  Findings:  Degenerative changes at the [facility] joint as before.  There is a somewhat high-riding left humeral head as before.  Negative for fracture or dislocation.  IMPRESSION:  Stable degenerative changes without fracture or dislocation.  Clinically significant discrepancy from primary report, if provided: None   Original Report Authenticated By: Thora Lance III, M.D.     Scheduled Meds:   . aspirin EC  81 mg Oral Daily  . azithromycin  500 mg Oral Daily  . donepezil  10 mg Oral QHS  . finasteride  5 mg Oral Daily  . levothyroxine  50 mcg Oral Daily  . lisinopril  10 mg Oral Daily  . loratadine  10 mg Oral  Daily  . memantine  10 mg Oral Daily  .  morphine injection  2 mg Intravenous Once  . sertraline  50 mg Oral Daily  . Tamsulosin HCl  0.4 mg Oral Daily  . terazosin  2 mg Oral QHS   Continuous Infusions:   Active Problems:  HTN (hypertension), benign  Dementia  BPH (benign prostatic hyperplasia)  Encephalopathy acute  Fall  Laceration of head  Debility    Time spent: 40 minutes   [facility]  [facility] Pager [phone]. If 8PM-8AM, please contact night-coverage at [email or web address], password [facility] 11/10/2011, 2:41 PM  LOS: 1 day

## 2011-11-10 NOTE — Progress Notes (Signed)
CSW to follow-up Monday with bed offers in anticipation of Tuesday discharge.   Clinical Social Work Department CLINICAL SOCIAL WORK PLACEMENT NOTE 11/10/2011  Patient:  Robert Preston, Robert Preston  Account Number:  [id number] Admit date:  11/09/2011  Clinical Social Worker:  Orpah Greek  Date/time:  11/10/2011 03:04 PM  Clinical Social Work is seeking post-discharge placement for this patient at the following level of care:   SKILLED NURSING   (*CSW will update this form in Epic as items are completed)   11/10/2011  Patient/family provided with [facility] Department of Clinical Social Work's list of facilities offering this level of care within the geographic area requested by the patient (or if unable, by the patient's family).  11/10/2011  Patient/family informed of their freedom to choose among providers that offer the needed level of care, that participate in Medicare, Medicaid or managed care program needed by the patient, have an available bed and are willing to accept the patient.  11/10/2011  Patient/family informed of [facility]' ownership interest in [facility], as well as of the fact that they are under no obligation to receive care at this facility.  PASARR submitted to EDS on 11/10/2011 PASARR number received from EDS on 11/10/2011  FL2 transmitted to all facilities in geographic area requested by pt/family on  11/10/2011 FL2 transmitted to all facilities within larger geographic area on   Patient informed that his/her managed care company has contracts with or will negotiate with  certain facilities, including the following:     Patient/family informed of bed offers received:   Patient chooses bed at  Physician recommends and patient chooses bed at    Patient to be transferred to  on   Patient to be transferred to facility by   The following physician request were entered in Epic:   Additional Comments:  Unice Bailey, LCSW [facility] Clinical Social Worker cell #: [phone]

## 2011-11-10 NOTE — Evaluation (Signed)
Physical Therapy Evaluation Patient Details Name: Robert Preston MRN: [id number] DOB: 1922/11/26 Today's Date: 11/10/2011 Time: 6213-0865 PT Time Calculation (min): 12 min  PT Assessment / Plan / Recommendation Clinical Impression  78 yo male admitted with acute encephalopathy, falls at home. No family present during eval. Pt confused but participated well with session. Demonstrated fall risk. Required Min-Mod assist for mobility. Recommend SNF for rehab unless family is able to provide current level of care/24 hour assist.     PT Assessment  Patient needs continued PT services    Follow Up Recommendations  Skilled nursing facility;Supervision/Assistance - 24 hour    Barriers to Discharge        Equipment Recommendations   (unsure of DME needs-no family present-RW if pt does not have already)    Recommendations for Other Services OT consult   Frequency Min 3X/week    Precautions / Restrictions Precautions Precautions: Fall Restrictions Weight Bearing Restrictions: No   Pertinent Vitals/Pain       Mobility  Bed Mobility Bed Mobility: Supine to Sit Supine to Sit: 3: Mod assist;HOB elevated;With rails Details for Bed Mobility Assistance: Multimodal cues for safety, technique,hand placement. Assist for bil LEs off bed and trunk to upright.  Transfers Transfers: Sit to Stand;Stand to Sit Sit to Stand: 3: Mod assist;With upper extremity assist;From bed;From elevated surface Stand to Sit: 4: Min assist;With upper extremity assist;With armrests;To chair/3-in-1 Details for Transfer Assistance: Multimodal cues for safety, technique, hand placement. Assist to rise, stabiilze, control descent.  Ambulation/Gait Ambulation/Gait Assistance: 3: Mod assist;4: Min assist Ambulation Distance (Feet): 100 Feet Assistive device: Rolling walker Ambulation/Gait Assistance Details: VCs safety, distance from RW, direction/instruction. Assist to stabilize, shift weight anteriorly initially,  maneuver with RW.  Gait Pattern: Step-through pattern    Exercises     PT Diagnosis: Difficulty walking;Generalized weakness;Altered mental status  PT Problem List: Decreased strength;Decreased balance;Decreased mobility;Decreased cognition;Decreased knowledge of use of DME;Decreased safety awareness PT Treatment Interventions: DME instruction;Gait training;Functional mobility training;Therapeutic activities;Therapeutic exercise;Balance training;Patient/family education   PT Goals Acute Rehab PT Goals PT Goal Formulation: Patient unable to participate in goal setting Time For Goal Achievement: 11/24/11 Potential to Achieve Goals: Good Pt will go Supine/Side to Sit: with supervision PT Goal: Supine/Side to Sit - Progress: Goal set today Pt will go Sit to Supine/Side: with supervision PT Goal: Sit to Supine/Side - Progress: Goal set today Pt will go Sit to Stand: with supervision PT Goal: Sit to Stand - Progress: Goal set today Pt will Ambulate: 51 - 150 feet;with supervision;with least restrictive assistive device PT Goal: Ambulate - Progress: Goal set today  Visit Information  Last PT Received On: 11/10/11 Assistance Needed: +2 (safety)    Subjective Data  Subjective: "I'll get up" Patient Stated Goal: None stated   Prior Functioning  Home Living Additional Comments: no family present during session. pt unable to provide accurate history Prior Function Comments: no family present during session. pt unable to provide accurate history Communication Communication: No difficulties    Cognition  Overall Cognitive Status: History of cognitive impairments - at baseline Arousal/Alertness: Awake/alert Orientation Level: Place;Time;Situation Behavior During Session: [facility] for tasks performed    Extremity/Trunk Assessment Right Lower Extremity Assessment RLE ROM/Strength/Tone: Deficits RLE ROM/Strength/Tone Deficits: Strength at least 4/5 throughout  Left Lower Extremity  Assessment LLE ROM/Strength/Tone: Deficits LLE ROM/Strength/Tone Deficits: Strength at least 4/5 throughout  Trunk Assessment Trunk Assessment: Normal   Balance Balance Balance Assessed: Yes Static Sitting Balance Static Sitting - Balance Support: Feet supported Static  Sitting - Level of Assistance: 4: Min Oncologist Standing - Balance Support: Bilateral upper extremity supported Static Standing - Level of Assistance: 4: Min assist;3: Mod assist Dynamic Standing Balance Dynamic Standing - Balance Support: Bilateral upper extremity supported Dynamic Standing - Level of Assistance: 3: Mod assist  End of Session PT - End of Session Equipment Utilized During Treatment: Gait belt Activity Tolerance: Patient tolerated treatment well Patient left: in chair;with chair alarm set Nurse Communication: Precautions;Mobility status (spoke with RN about checking on pt often)  GP     Rebeca Alert [facility] 11/10/2011, 11:43 AM [phone]

## 2011-11-10 NOTE — ED Notes (Signed)
Attempted to call report to floor  Nurse to return my call when she is available to take report

## 2011-11-10 NOTE — Progress Notes (Signed)
   CARE MANAGEMENT NOTE 11/10/2011  Patient:  Robert Preston, Robert Preston   Account Number:  [id number]  Date Initiated:  11/10/2011  Documentation initiated by:  Jiles Crocker  Subjective/Objective Assessment:   ADMITTED WITH FALL, ENCEPHALOPATHY     Action/Plan:   PCP: Nadean Corwin, MD  LIVES AT HOME WITH SPOUSE WITH HHC PROVIDED BY [facility]   Anticipated DC Date:  11/11/2011   Anticipated DC Plan:  HOME W HOME HEALTH SERVICES      DC Planning Services  CM consult            Status of service:  In process, will continue to follow Medicare Important Message given?  NA - LOS <3 / Initial given by admissions (If response is "NO", the following Medicare IM given date fields will be blank)  Per UR Regulation:  Reviewed for med. necessity/level of care/duration of stay  Comments:  11/10/2011- PATIENT ADMITTED AS OBSERVATIONAL STATUS; KELLY SOC WORKER MADE AWARE/ PATIENT WITH POSSIBLE SNF PLACEMENT; B Yashua Bracco RN, BSN, MHA

## 2011-11-10 NOTE — H&P (Signed)
PCP:   Nadean Corwin, MD   Chief Complaint:  Fall  HPI: This is a 78 year old gentleman who lives at home, he was attempting to go to the restroom with his walker, when he had a mechanical fall and fell onto the tub. It was an unwitnessed fall, his wife was in the next room. He didn't hit his head and sustained a laceration. She believes there may have been a brief loss of consciousness. In the ER patient was initially lethargic, then became confused after pain medications. The patient does have baseline moderate dementia.  Of note, this is the patient 9th fall this year. He was just discharged from [facility] on August 13. He was there for rehabilitation after a fall which resulted in a fractured right femur. At home he has physical therapy and occupational therapy provided by [facility]. He lives with his elderly wife, they have a visiting aide that gives 2 hours in the a.m. and one half was in the p.m.  History provided by his wife and his stepdaughter who are present at the bedside. Patient is confused and unable to provide any history.  Review of Systems: Per family The patient denies anorexia, fever, weight loss,, vision loss, decreased hearing, hoarseness, chest pain, syncope, dyspnea on exertion, peripheral edema,hemoptysis, abdominal pain, melena, hematochezia, severe indigestion/heartburn, hematuria, incontinence, genital sores, muscle weakness, suspicious skin lesions, transient blindness, depression, unusual weight change, abnormal bleeding, enlarged lymph nodes, angioedema, and breast masses.  Past Medical History: Past Medical History  Diagnosis Date  . Hypertension   . Hypercholesterolemia   . DEMENTIA   . Arthritis   . Hypothyroidism   . Neuromuscular disorder     carpal tunnel   Past Surgical History  Procedure Date  . Joint replacement   . Eye surgery     lasik both eyes  . Transurethral resection of prostate [id number]  . Shoulder arthroscopy 2005      right  . Shoulder arthroscopy 2002    rt  . Total hip arthroplasty     right and left  . Cholecystectomy   . Colonoscopy   . Carpal tunnel release 05/10/2011    Procedure: CARPAL TUNNEL RELEASE;  Surgeon: Nicki Reaper, MD;  Location: [facility];  Service: Orthopedics;  Laterality: Right;    Medications: Prior to Admission medications   Medication Sig Start Date End Date Taking? Authorizing Provider  aspirin EC 81 MG tablet Take 81 mg by mouth daily.    Historical Provider, MD  Cholecalciferol (VITAMIN D) 2000 UNITS tablet Take 2,000 Units by mouth daily.    Historical Provider, MD  donepezil (ARICEPT) 10 MG tablet Take 10 mg by mouth at bedtime.     Historical Provider, MD  finasteride (PROSCAR) 5 MG tablet Take 5 mg by mouth daily.      Historical Provider, MD  levothyroxine (SYNTHROID, LEVOTHROID) 50 MCG tablet Take 50 mcg by mouth daily.    Historical Provider, MD  lisinopril (PRINIVIL,ZESTRIL) 20 MG tablet Take 10 mg by mouth daily.    Historical Provider, MD  loratadine (CLARITIN) 10 MG tablet Take 10 mg by mouth daily.      Historical Provider, MD  LORazepam (ATIVAN) 0.5 MG tablet Take 0.5 mg by mouth every 8 (eight) hours as needed. For anxiety.    Historical Provider, MD  memantine (NAMENDA) 10 MG tablet Take 10 mg by mouth daily.    Historical Provider, MD  sertraline (ZOLOFT) 50 MG tablet Take 50 mg by  mouth daily.    Historical Provider, MD  simvastatin (ZOCOR) 80 MG tablet Take 40 mg by mouth at bedtime.    Historical Provider, MD  Tamsulosin HCl (FLOMAX) 0.4 MG CAPS Take 0.4 mg by mouth daily.    Historical Provider, MD  terazosin (HYTRIN) 2 MG capsule Take 2 mg by mouth at bedtime.    Historical Provider, MD    Allergies:   Allergies  Allergen Reactions  . Penicillins Hives    Social History:  reports that he has never smoked. He has never used smokeless tobacco. He reports that he does not drink alcohol or use illicit drugs. lives at home with  wife. Uses a walker. Fall risk. POA: wife Lurena Joiner [phone] (h); daughter Graciella Belton [phone]  Family History: Hypertension  Physical Exam: Filed Vitals:   11/09/11 1735 11/09/11 1737 11/09/11 2343  BP:  103/50 147/64  Pulse:  75 79  Temp:  97.9 F (36.6 C) 97.9 F (36.6 C)  TempSrc:  Oral Oral  Resp:  16 16  SpO2: 100% 100% 98%    General:  Confused, well developed and nourished, no acute distress Eyes: PERRLA, pink conjunctiva, no scleral icterus ENT: Moist oral mucosa, neck supple, no thyromegaly, head lacerations right forehead  Lungs: clear to ascultation, no wheeze, no crackles, no use of accessory muscles Cardiovascular: regular rate and rhythm, no regurgitation, no gallops, no murmurs. No carotid bruits, no JVD Abdomen: soft, positive BS, non-tender, non-distended, no organomegaly, not an acute abdomen GU: not examined Neuro: CN II - XII grossly intact, sensation unable to assess due to patient's mentation Musculoskeletal: strength decreased may be effort dependent, no clubbing, cyanosis or edema Skin: no rash, no subcutaneous crepitation, no decubitus   Labs on Admission:   [facility] 11/09/11 1954  NA 138  K 4.0  CL 101  CO2 29  GLUCOSE 189*  BUN 23  CREATININE 1.05  CALCIUM 9.1  MG --  PHOS --   No results found for this basename: AST:2,ALT:2,ALKPHOS:2,BILITOT:2,PROT:2,ALBUMIN:2 in the last 72 hours No results found for this basename: LIPASE:2,AMYLASE:2 in the last 72 hours  Basename 11/09/11 1954  WBC 12.8*  NEUTROABS 11.1*  HGB 9.9*  HCT 30.8*  MCV 96.3  PLT 328   No results found for this basename: CKTOTAL:3,CKMB:3,CKMBINDEX:3,TROPONINI:3 in the last 72 hours No components found with this basename: POCBNP:3 No results found for this basename: DDIMER:2 in the last 72 hours No results found for this basename: HGBA1C:2 in the last 72 hours No results found for this basename: CHOL:2,HDL:2,LDLCALC:2,TRIG:2,CHOLHDL:2,LDLDIRECT:2 in the last 72 hours No  results found for this basename: TSH,T4TOTAL,FREET3,T3FREE,THYROIDAB in the last 72 hours No results found for this basename: VITAMINB12:2,FOLATE:2,FERRITIN:2,TIBC:2,IRON:2,RETICCTPCT:2 in the last 72 hours  Micro Results: No results found for this or any previous visit (from the past 240 hour(s)). Results for SAM, OVERBECK (MRN [id number]) as of 11/10/2011 00:08  Ref. Range 11/09/2011 20:56  Color, Urine Latest Range: YELLOW  YELLOW  APPearance Latest Range: CLEAR  CLEAR  Specific Gravity, Urine Latest Range: 1.005-1.030  1.026  pH Latest Range: 5.0-8.0  6.0  Glucose Latest Range: NEGATIVE mg/dL NEGATIVE  Bilirubin Urine Latest Range: NEGATIVE  SMALL (A)  Ketones, ur Latest Range: NEGATIVE mg/dL NEGATIVE  Protein Latest Range: NEGATIVE mg/dL NEGATIVE  Urobilinogen, UA Latest Range: 0.0-1.0 mg/dL 1.0  Nitrite Latest Range: NEGATIVE  NEGATIVE  Leukocytes, UA Latest Range: NEGATIVE  TRACE (A)  Hgb urine dipstick Latest Range: NEGATIVE  NEGATIVE  Urine-Other No range found MUCOUS PRESENT  WBC,  UA Latest Range: <3 WBC/hpf 0-2  Squamous Epithelial / LPF Latest Range: RARE  FEW (A)  Bacteria, UA Latest Range: RARE  RARE    Radiological Exams on Admission: Dg Pelvis 1-2 Views  11/09/2011  *RADIOLOGY REPORT*  Clinical Data: Fall.  Bilateral hip pain.  PELVIS - 1-2 VIEW  Comparison: None.  Findings: Bilateral total hip arthroplasties are in place.  Both devices are located and there is no fracture.  Asymmetric positioning of the femoral heads in the acetabular cup is consistent with wear of the linings of the cups.  IMPRESSION:  1.  No acute finding. 2.  Findings compatible with wear of the lining of the patient's acetabular cups.   Original Report Authenticated By: Bernadene Bell. Maricela Curet, M.D.    Dg Shoulder Right  11/09/2011  *RADIOLOGY REPORT*  Clinical Data: Fall, pain.  RIGHT SHOULDER - 2+ VIEW  Comparison: Plain films 09/04/2011.  Findings: There is no fracture or dislocation.  Postoperative  change noted.  Glenohumeral degenerative disease is present. Imaged right lung and ribs are unremarkable.  IMPRESSION: No acute finding.   Original Report Authenticated By: Bernadene Bell. Maricela Curet, M.D.    Dg Femur Right  11/09/2011  *RADIOLOGY REPORT*  Clinical Data: Fall.  Pain.  RIGHT FEMUR - 2 VIEW  Comparison: Plain films right hip 09/03/2011.  Findings: Right total hip replacement is in place.  The device is located.  Periprosthetic fracture along the lateral margin of the proximal femur shows progressive healing.  No new fracture is identified.  There is a small knee joint effusion with degenerative change about the knee.  IMPRESSION:  1.  No acute finding. 2.  Healing incomplete periprosthetic fracture proximal right femur.   Original Report Authenticated By: Bernadene Bell. Maricela Curet, M.D.    Ct Head Wo Contrast  11/09/2011  *RADIOLOGY REPORT*  Clinical Data:  Status post fall.  Pain is.  Right.  No free pain.  CT HEAD WITHOUT CONTRAST CT CERVICAL SPINE WITHOUT CONTRAST  Technique:  Multidetector CT imaging of the head and cervical spine was performed following the standard protocol without intravenous contrast.  Multiplanar CT image reconstructions of the cervical spine were also generated.  Comparison:  Head and cervical spine CT scan 10/26/2011.  CT HEAD  Findings: Marked cortical atrophy and extensive chronic microvascular ischemic change are again seen.  There is a scalp contusion over the left parietal bone, new since the prior study. No evidence of acute intracranial abnormality including infarction, hemorrhage, mass lesion, mass effect, midline shift or abnormal extra-axial fluid collection.  No hydrocephalus or pneumocephalus. Calvarium intact.  IMPRESSION:  1.  Scalp contusion over the left parietal bone.  No underlying fracture or acute intracranial abnormality. 2.  Marked atrophy and chronic microvascular ischemic change.  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  There is no fracture or subluxation of the cervical spine. Marked multilevel degenerative disease is unchanged in appearance. Lung apices are clear.  IMPRESSION: No acute finding.  Marked multilevel degenerative disease.   Original Report Authenticated By: Bernadene Bell. Maricela Curet, M.D.    Ct Cervical Spine Wo Contrast  11/09/2011  *RADIOLOGY REPORT*  Clinical Data:  Status post fall.  Pain is.  Right.  No free pain.  CT HEAD WITHOUT CONTRAST CT CERVICAL SPINE WITHOUT CONTRAST  Technique:  Multidetector CT imaging of the head and cervical spine was performed following the standard protocol without intravenous contrast.  Multiplanar CT image reconstructions of the  cervical spine were also generated.  Comparison:  Head and cervical spine CT scan 10/26/2011.  CT HEAD  Findings: Marked cortical atrophy and extensive chronic microvascular ischemic change are again seen.  There is a scalp contusion over the left parietal bone, new since the prior study. No evidence of acute intracranial abnormality including infarction, hemorrhage, mass lesion, mass effect, midline shift or abnormal extra-axial fluid collection.  No hydrocephalus or pneumocephalus. Calvarium intact.  IMPRESSION:  1.  Scalp contusion over the left parietal bone.  No underlying fracture or acute intracranial abnormality. 2.  Marked atrophy and chronic microvascular ischemic change.  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  There is no fracture or subluxation of the cervical spine. Marked multilevel degenerative disease is unchanged in appearance. Lung apices are clear.  IMPRESSION: No acute finding.  Marked multilevel degenerative disease.   Original Report Authenticated By: Bernadene Bell. Maricela Curet, M.D.     Assessment/Plan Present on Admission:  .Encephalopathy acute Likely due to narcotics. Will admit for observation, limited narcotics for pain  Fall/debility  Physical therapy  consulted. Family may reconsider placement Head laceration  Sutured,  monitor  Fall risk Bed alarm ordered  .HTN (hypertension), benign .Dementia .BPH (benign prostatic hyperplasia) Stable home medications resumed    DO NOT RESUSCITATE SCDs for DVT prophylaxis    Robert Preston 11/10/2011, 12:08 AM

## 2011-11-10 NOTE — Progress Notes (Signed)
*  PRELIMINARY RESULTS* Vascular Ultrasound Carotid Duplex (Doppler) has been completed.  Preliminary findings: Right= no significant ICA stenosis with antegrade vertebral flow. Left= 40-59% Mid and Distal ICA stenosis with antegrade vertebral flow.  Farrel Demark, RDMS, RVT  11/10/2011, 12:04 PM

## 2011-11-10 NOTE — Progress Notes (Signed)
  Echocardiogram 2D Echocardiogram has been performed.  Robert Preston FRANCES 11/10/2011, 3:30 PM

## 2011-11-10 NOTE — Progress Notes (Signed)
Clinical Social Work Department BRIEF PSYCHOSOCIAL ASSESSMENT 11/10/2011  Patient:  Robert Preston, Robert Preston     Account Number:  [id number]     Admit date:  11/09/2011  Clinical Social Worker:  Orpah Greek  Date/Time:  11/10/2011 02:50 PM  Referred by:  Physician  Date Referred:  11/10/2011 Referred for  SNF Placement   Other Referral:   Interview type:  Family Other interview type:    PSYCHOSOCIAL DATA Living Status:  WIFE Admitted from facility:   Level of care:   Primary support name:  Robert Preston (wife) h#: [phone] Primary support relationship to patient:  SPOUSE Degree of support available:   good    CURRENT CONCERNS Current Concerns  Post-Acute Placement   Other Concerns:    SOCIAL WORK ASSESSMENT / PLAN CSW spoke with patient's wife & daughter, Robert Preston at bedside re: discharge planning. Patient had been to [facility] in the past but would like to see if [facility] has any availability.   Assessment/plan status:  Information/Referral to [facility] Other assessment/ plan:   Information/referral to community resources:   CSW completed FL2 and faxed information to [facility] & [facility] as a backup. CSW spoke with [facility] who states that they may have a bed available Tuesday.    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 CSW to follow-up Monday with bed offers in anticipation of Tuesday discharge.        Unice Bailey, LCSW [facility] Clinical Social Worker cell #: [phone]

## 2011-11-11 DIAGNOSIS — I10 Essential (primary) hypertension: Secondary | ICD-10-CM

## 2011-11-11 LAB — BASIC METABOLIC PANEL
CO2: 28 mEq/L (ref 19–32)
Calcium: 9.1 mg/dL (ref 8.4–10.5)
Creatinine, Ser: 1.08 mg/dL (ref 0.50–1.35)
GFR calc non Af Amer: 60 mL/min — ABNORMAL LOW (ref >90)
Glucose, Bld: 94 mg/dL (ref 70–99)

## 2011-11-11 LAB — CBC
Hemoglobin: 9.5 g/dL — ABNORMAL LOW (ref 13.0–17.0)
MCH: 31.5 pg (ref 26.0–34.0)
MCHC: 32.8 g/dL (ref 30.0–36.0)
MCV: 96 fL (ref 78.0–100.0)
RBC: 3.02 MIL/uL — ABNORMAL LOW (ref 4.22–5.81)

## 2011-11-11 MED ORDER — LISINOPRIL 5 MG PO TABS
5.0000 mg | ORAL_TABLET | Freq: Every day | ORAL | Status: DC
  Administered 2011-11-11: 5 mg via ORAL
  Filled 2011-11-11 (×2): qty 1

## 2011-11-11 NOTE — Progress Notes (Signed)
[facility] PROGRESS NOTE  Robert Preston UJW:[id number] DOB: [birth year] DOA: 11/09/2011 PCP: Nadean Corwin, MD  Assessment/Plan: Active Problems:  HTN (hypertension), benign  Dementia  BPH (benign prostatic hyperplasia)  Encephalopathy acute  Fall  Laceration of head  Debility    1. Recurrent falls,Multifactorial patient has a head laceration that was sutured in the ER. According to the family the patient is fallen 9 times this year. He had a periprosthetic fracture, and is still healing from that. He was in acute rehabilitation for 6 weeks and was discharged 2 weeks ago., Family is open to acute rehabilitation. 2. Recent episode of acute bronchitis chest x-ray negative for pneumonia, continue Zithromax       3.Recurrent syncope full workup including a 2-D echo which shows an EF of 55-60% with mild aortic valve stenosis., carotid Doppler shows left ICA stenosis 40-59%, MRI could not be done because of staples. Doubt TIA but continue aspirin 81 mg daily. We'll check orthostatic vitals today mildly hypotensive therefore we'll decrease dose of lisinopril  Status: full  Family Communication: family updated about patient's clinical progress  Disposition Plan: Possible discharge on Monday to SNF   Brief narrative:  78 year old gentleman who lives at home, he was attempting to go to the restroom with his walker, when he had a mechanical fall and fell onto the tub. It was an unwitnessed fall, his wife was in the next room. He didn't hit his head and sustained a laceration  Consultants:  none Procedures:  none Antibiotics:  start azithromycin HPI/Subjective:  feeling better today     Objective: Filed Vitals:   11/10/11 0556 11/10/11 1510 11/10/11 2116 11/11/11 0500  BP: 110/57 100/54 132/69 135/68  Pulse: 95 70 69 59  Temp: 99.6 F (37.6 C) 98.2 F (36.8 C) 98 F (36.7 C) 97.7 F (36.5 C)  TempSrc: Oral Oral Oral Oral  Resp: 20 16 16 16   Height: 6\' 2"  (1.88 m)      Weight: 78.6 kg (173 lb 4.5 oz)     SpO2: 96% 96% 95% 97%    Intake/Output Summary (Last 24 hours) at 11/11/11 0931 Last data filed at 11/10/11 2100  Gross per 24 hour  Intake    360 ml  Output    200 ml  Net    160 ml    Exam:  HENT:  Head: Atraumatic.  Nose: Nose normal.  Mouth/Throat: Oropharynx is clear and moist.  Eyes: Conjunctivae are normal. Pupils are equal, round, and reactive to light. No scleral icterus.  Neck: Neck supple. No tracheal deviation present.  Cardiovascular: Normal rate, regular rhythm, normal heart sounds and intact distal pulses.  Pulmonary/Chest: Effort normal and breath sounds normal. No respiratory distress.  Abdominal: Soft. Normal appearance and bowel sounds are normal. She exhibits no distension. There is no tenderness.  Musculoskeletal: She exhibits no edema and no tenderness.  Neurological: She is alert. No cranial nerve deficit.    Data Reviewed: Basic Metabolic Panel:  Lab 11/11/11 5621 11/10/11 0420 11/09/11 1954  NA 139 141 138  K 3.9 3.3* 4.0  CL 103 105 101  CO2 28 28 29   GLUCOSE 94 101* 189*  BUN 21 19 23   CREATININE 1.08 0.96 1.05  CALCIUM 9.1 9.1 9.1  MG -- 2.0 --  PHOS -- -- --    Liver Function Tests: No results found for this basename: AST:5,ALT:5,ALKPHOS:5,BILITOT:5,PROT:5,ALBUMIN:5 in the last 168 hours No results found for this basename: LIPASE:5,AMYLASE:5 in the last 168 hours No results found  for this basename: AMMONIA:5 in the last 168 hours  CBC:  Lab 11/11/11 0508 11/10/11 0420 11/09/11 1954  WBC 8.1 11.8* 12.8*  NEUTROABS -- -- 11.1*  HGB 9.5* 9.7* 9.9*  HCT 29.0* 28.5* 30.8*  MCV 96.0 94.4 96.3  PLT 290 332 328    Cardiac Enzymes: No results found for this basename: CKTOTAL:5,CKMB:5,CKMBINDEX:5,TROPONINI:5 in the last 168 hours BNP (last 3 results) No results found for this basename: PROBNP:3 in the last 8760 hours   CBG: No results found for this basename: GLUCAP:5 in the last 168  hours  No results found for this or any previous visit (from the past 240 hour(s)).   Studies: Dg Chest 2 View  11/10/2011  *RADIOLOGY REPORT*  Clinical Data: Coughing and weakness.  CHEST - 2 VIEW  Comparison: 09/03/2011  Findings: The lungs are clear without focal infiltrate, edema, pneumothorax or pleural effusion. Cardiopericardial silhouette is at upper limits of normal for size. Imaged bony structures of the thorax are intact.  IMPRESSION: Stable.  No acute findings.   Original Report Authenticated By: ERIC A. MANSELL, M.D.    Dg Pelvis 1-2 Views  11/09/2011  *RADIOLOGY REPORT*  Clinical Data: Fall.  Bilateral hip pain.  PELVIS - 1-2 VIEW  Comparison: None.  Findings: Bilateral total hip arthroplasties are in place.  Both devices are located and there is no fracture.  Asymmetric positioning of the femoral heads in the acetabular cup is consistent with wear of the linings of the cups.  IMPRESSION:  1.  No acute finding. 2.  Findings compatible with wear of the lining of the patient's acetabular cups.   Original Report Authenticated By: Bernadene Bell. Maricela Curet, M.D.    Dg Shoulder Right  11/09/2011  *RADIOLOGY REPORT*  Clinical Data: Fall, pain.  RIGHT SHOULDER - 2+ VIEW  Comparison: Plain films 09/04/2011.  Findings: There is no fracture or dislocation.  Postoperative change noted.  Glenohumeral degenerative disease is present. Imaged right lung and ribs are unremarkable.  IMPRESSION: No acute finding.   Original Report Authenticated By: Bernadene Bell. D'ALESSIO, M.D.    Dg Elbow Complete Left  10/30/2011  *RADIOLOGY REPORT*  Clinical Data: Recent fall  LEFT ELBOW - COMPLETE 3+ VIEW  Comparison: None  Findings: On the lateral view there is a small elbow joint effusion present somewhat displacing the anterior fat pad.  No definite radial fracture is seen.  There is however slight cortical irregularity of the coronoid process of the proximal ulna and a nondisplaced fracture is a consideration.  IMPRESSION:  Small elbow effusion.  Suspect fracture possibly of the coronoid process of the proximal left ulna.  Clinically significant discrepancy from primary report, if provided: None   Original Report Authenticated By: Juline Patch, M.D.    Dg Forearm Left  10/30/2011  *RADIOLOGY REPORT*  Clinical Data: Larey Seat  LEFT FOREARM - 2 VIEW  Comparison: 02/15/2011  Findings: Fracture of the coronoid process of the ulna, distracted 1 mm and displaced distally 2- 3 mm.  Distal forearm unremarkable.  IMPRESSION:  1.  Fracture of the coronoid process of the ulna as above.  Clinically significant discrepancy from primary report, if provided: None   Original Report Authenticated By: Osa Craver, M.D.    Dg Wrist Complete Left  10/30/2011  *RADIOLOGY REPORT*  Clinical Data: Larey Seat  LEFT WRIST - COMPLETE 3+ VIEW  Comparison: None.  Findings: Carpal rows intact. Negative for fracture, dislocation, or other acute abnormality.  Normal alignment and mineralization. No significant degenerative  change.  Regional soft tissues unremarkable.  IMPRESSION:  Negative  Clinically significant discrepancy from primary report, if provided: None   Original Report Authenticated By: Thora Lance III, M.D.    Dg Hip Complete Left  10/26/2011  *RADIOLOGY REPORT*  Clinical Data: Recent fall, left hip pain  LEFT HIP - COMPLETE 2+ VIEW  Comparison: Pelvis left hip films of 02/15/2011  Findings: Bilateral total hip replacements are present.  The femoral and acetabular components appear unchanged in position on the frontal view.  The bones are diffusely osteopenic.  No acute fracture is seen.  There are degenerative changes noted in the lower lumbar spine.  IMPRESSION: Stable appearance of bilateral total hip replacements.  Diffuse osteopenia with degenerative change in the lower lumbar spine.  Original Report Authenticated By: Juline Patch, M.D.   Dg Femur Right  11/09/2011  *RADIOLOGY REPORT*  Clinical Data: Fall.  Pain.  RIGHT FEMUR - 2  VIEW  Comparison: Plain films right hip 09/03/2011.  Findings: Right total hip replacement is in place.  The device is located.  Periprosthetic fracture along the lateral margin of the proximal femur shows progressive healing.  No new fracture is identified.  There is a small knee joint effusion with degenerative change about the knee.  IMPRESSION:  1.  No acute finding. 2.  Healing incomplete periprosthetic fracture proximal right femur.   Original Report Authenticated By: Bernadene Bell. Maricela Curet, M.D.    Ct Head Wo Contrast  11/09/2011  *RADIOLOGY REPORT*  Clinical Data:  Status post fall.  Pain is.  Right.  No free pain.  CT HEAD WITHOUT CONTRAST CT CERVICAL SPINE WITHOUT CONTRAST  Technique:  Multidetector CT imaging of the head and cervical spine was performed following the standard protocol without intravenous contrast.  Multiplanar CT image reconstructions of the cervical spine were also generated.  Comparison:  Head and cervical spine CT scan 10/26/2011.  CT HEAD  Findings: Marked cortical atrophy and extensive chronic microvascular ischemic change are again seen.  There is a scalp contusion over the left parietal bone, new since the prior study. No evidence of acute intracranial abnormality including infarction, hemorrhage, mass lesion, mass effect, midline shift or abnormal extra-axial fluid collection.  No hydrocephalus or pneumocephalus. Calvarium intact.  IMPRESSION:  1.  Scalp contusion over the left parietal bone.  No underlying fracture or acute intracranial abnormality. 2.  Marked atrophy and chronic microvascular ischemic change.  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  There is no fracture or subluxation of the cervical spine. Marked multilevel degenerative disease is unchanged in appearance. Lung apices are clear.  IMPRESSION: No acute finding.  Marked multilevel degenerative disease.   Original Report Authenticated By: Bernadene Bell. Maricela Curet,  M.D.    Ct Head Wo Contrast  10/26/2011  *RADIOLOGY REPORT*  Clinical Data:  Larey Seat and hit head.  Neck pain.  Head pain.  CT HEAD WITHOUT CONTRAST CT CERVICAL SPINE WITHOUT CONTRAST  Technique:  Multidetector CT imaging of the head and cervical spine was performed following the standard protocol without intravenous contrast.  Multiplanar CT image reconstructions of the cervical spine were also generated.  Comparison:  MRI cervical spine 04/11/2011.  CT head cervical spine 03/16/2011.  CT HEAD  Findings: There is no evidence for acute infarction, intracranial hemorrhage, mass lesion, hydrocephalus, or extra-axial fluid. Moderate to severe atrophy is present.  Advanced chronic microvascular ischemic change is present in the periventricular and subcortical white matter.  The calvarium is intact.  There is moderate vascular calcification. There is no acute sinus or mastoid fluid.  There is a similar appearance to priors.  IMPRESSION: Atrophy and chronic microvascular ischemic change.  No skull fracture or intracranial hemorrhage.  CT CERVICAL SPINE  Findings: No visible cervical spine fracture or traumatic subluxation.  No prevertebral soft tissue swelling or visible intraspinal hematoma.  Trachea midline.  Lung apices clear.  No upper rib fracture.  Advanced disc space narrowing C3-C7. Bilateral carotid calcification.  Multilevel facet arthropathy.  IMPRESSION: No visible cervical spine fracture or traumatic subluxation. Similar appearance to priors.  Original Report Authenticated By: Elsie Stain, M.D.   Ct Cervical Spine Wo Contrast  11/09/2011  *RADIOLOGY REPORT*  Clinical Data:  Status post fall.  Pain is.  Right.  No free pain.  CT HEAD WITHOUT CONTRAST CT CERVICAL SPINE WITHOUT CONTRAST  Technique:  Multidetector CT imaging of the head and cervical spine was performed following the standard protocol without intravenous contrast.  Multiplanar CT image reconstructions of the cervical spine were also  generated.  Comparison:  Head and cervical spine CT scan 10/26/2011.  CT HEAD  Findings: Marked cortical atrophy and extensive chronic microvascular ischemic change are again seen.  There is a scalp contusion over the left parietal bone, new since the prior study. No evidence of acute intracranial abnormality including infarction, hemorrhage, mass lesion, mass effect, midline shift or abnormal extra-axial fluid collection.  No hydrocephalus or pneumocephalus. Calvarium intact.  IMPRESSION:  1.  Scalp contusion over the left parietal bone.  No underlying fracture or acute intracranial abnormality. 2.  Marked atrophy and chronic microvascular ischemic change.  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  There is no fracture or subluxation of the cervical spine. Marked multilevel degenerative disease is unchanged in appearance. Lung apices are clear.  IMPRESSION: No acute finding.  Marked multilevel degenerative disease.   Original Report Authenticated By: Bernadene Bell. Maricela Curet, M.D.    Ct Cervical Spine Wo Contrast  10/26/2011  *RADIOLOGY REPORT*  Clinical Data:  Larey Seat and hit head.  Neck pain.  Head pain.  CT HEAD WITHOUT CONTRAST CT CERVICAL SPINE WITHOUT CONTRAST  Technique:  Multidetector CT imaging of the head and cervical spine was performed following the standard protocol without intravenous contrast.  Multiplanar CT image reconstructions of the cervical spine were also generated.  Comparison:  MRI cervical spine 04/11/2011.  CT head cervical spine 03/16/2011.  CT HEAD  Findings: There is no evidence for acute infarction, intracranial hemorrhage, mass lesion, hydrocephalus, or extra-axial fluid. Moderate to severe atrophy is present.  Advanced chronic microvascular ischemic change is present in the periventricular and subcortical white matter.  The calvarium is intact.  There is moderate vascular calcification. There is no acute sinus or mastoid fluid.  There is  a similar appearance to priors.  IMPRESSION: Atrophy and chronic microvascular ischemic change.  No skull fracture or intracranial hemorrhage.  CT CERVICAL SPINE  Findings: No visible cervical spine fracture or traumatic subluxation.  No prevertebral soft tissue swelling or visible intraspinal hematoma.  Trachea midline.  Lung apices clear.  No upper rib fracture.  Advanced disc space narrowing C3-C7. Bilateral carotid calcification.  Multilevel facet arthropathy.  IMPRESSION: No visible cervical spine fracture or traumatic subluxation. Similar appearance to priors.  Original Report Authenticated By: Elsie Stain, M.D.   Dg Shoulder Left  10/30/2011  *RADIOLOGY REPORT*  Clinical Data: Larey Seat  LEFT SHOULDER -  2+ VIEW  Comparison: 03/16/2011  Findings: Degenerative changes at the [facility] joint as before.  There is a somewhat high-riding left humeral head as before.  Negative for fracture or dislocation.  IMPRESSION:  Stable degenerative changes without fracture or dislocation.  Clinically significant discrepancy from primary report, if provided: None   Original Report Authenticated By: Thora Lance III, M.D.     Scheduled Meds:   . aspirin EC  81 mg Oral Daily  . azithromycin  500 mg Oral Daily  . donepezil  10 mg Oral QHS  . finasteride  5 mg Oral Daily  . levothyroxine  50 mcg Oral Daily  . lisinopril  10 mg Oral Daily  . loratadine  10 mg Oral Daily  . memantine  10 mg Oral Daily  . potassium chloride  40 mEq Oral Once  . sertraline  50 mg Oral Daily  . Tamsulosin HCl  0.4 mg Oral Daily  . terazosin  2 mg Oral QHS   Continuous Infusions:   Active Problems:  HTN (hypertension), benign  Dementia  BPH (benign prostatic hyperplasia)  Encephalopathy acute  Fall  Laceration of head  Debility    Time spent: 40 minutes   [facility]  [facility] Pager [phone]. If 8PM-8AM, please contact night-coverage at [email or web address], password [facility] 11/11/2011, 9:31 AM  LOS: 2 days

## 2011-11-12 DIAGNOSIS — S0190XA Unspecified open wound of unspecified part of head, initial encounter: Secondary | ICD-10-CM

## 2011-11-12 NOTE — Progress Notes (Signed)
OT Cancellation Note  Treatment cancelled today due to patient's refusal to participate. Pt reported he was too fatigued after not sleeping well.  Will recheck on pt next day. Alba Cory 11/12/2011, 10:00 AM

## 2011-11-12 NOTE — Progress Notes (Signed)
[facility] PROGRESS NOTE  Robert Preston WUJ:[id number] DOB: February 14, 1923 DOA: 11/09/2011 PCP: Nadean Corwin, MD  Assessment/Plan: Active Problems:  HTN (hypertension), benign  Dementia  BPH (benign prostatic hyperplasia)  Encephalopathy acute  Fall  Laceration of head  Debility  1. Recurrent falls,Multifactorial patient has a head laceration that was sutured in the ER. According to the family the patient is fallen  9 times this year. He had a periprosthetic fracture, and is still healing from that. He was in acute rehabilitation for 6 weeks and was discharged 2 weeks ago. Awaiting SNF 2. Recent episode of acute bronchitis - clinically improved, chest x-ray with no acute findings, continue Z-Pak. 3.   Recurrent syncope full workup including a 2-D echo which shows an EF of 55-60% with mild aortic valve stenosis., carotid Doppler shows left ICA stenosis 40-59%, MRI could not be done because of staples. Doubt TIA but continue aspirin 81 mg daily.  -still mildly hypotensive will dc lisinopril and hytrin, monitor BPs and further treat accordingly.  Status: full Family Communication:  Disposition Plan:  Awaiting SNF    Brief narrative: 78 year old gentleman who lives at home, he was attempting to go to the restroom with his walker, when he had a mechanical fall and fell onto the tub. It was an unwitnessed fall, his wife was in the next room. He didn't hit his head and sustained a laceration   Consultants:   none  Procedures:   none  Antibiotics:   start azithromycin  HPI/Subjective: Alert and conversant, denies any c/o. States he feels much better although he still does not know what causes his falls.   Objective: Filed Vitals:   11/11/11 1037 11/11/11 2150 11/12/11 0627 11/12/11 0628  BP: 90/50 115/62 130/68 108/69  Pulse: 85 66 62 85  Temp:  97.9 F (36.6 C) 98 F (36.7 C)   TempSrc:  Oral Oral   Resp:  20 20   Height:      Weight:   78.382 kg (172 lb  12.8 oz)   SpO2:  96% 95%     Intake/Output Summary (Last 24 hours) at 11/12/11 1030 Last data filed at 11/11/11 2151  Gross per 24 hour  Intake    240 ml  Output    700 ml  Net   -460 ml    Exam:  In Gen: alert and oriented x1, answers simple questions appropriately Head: L parietal area laceration.  Nose: Nose normal.  Mouth/Throat: Oropharynx is clear and moist.  Eyes: Conjunctivae are normal. Pupils are equal, round, and reactive to light. No scleral icterus.  Neck: Neck supple. No tracheal deviation present.  Cardiovascular: Normal rate, regular rhythm, normal heart sounds and intact distal pulses.  Pulmonary/Chest: Effort normal and breath sounds normal. No respiratory distress.  Abdominal: Soft. Normal appearance and bowel sounds are normal. She exhibits no distension. There is no tenderness.  Extremitiesl: No edema, no cyanosis and.  Neurological: She is alert. No cranial nerve deficit.    Data Reviewed: Basic Metabolic Panel:  Lab 11/11/11 9562 11/10/11 0420 11/09/11 1954  NA 139 141 138  K 3.9 3.3* 4.0  CL 103 105 101  CO2 28 28 29   GLUCOSE 94 101* 189*  BUN 21 19 23   CREATININE 1.08 0.96 1.05  CALCIUM 9.1 9.1 9.1  MG -- 2.0 --  PHOS -- -- --    Liver Function Tests: No results found for this basename: AST:5,ALT:5,ALKPHOS:5,BILITOT:5,PROT:5,ALBUMIN:5 in the last 168 hours No results found for this basename:  LIPASE:5,AMYLASE:5 in the last 168 hours No results found for this basename: AMMONIA:5 in the last 168 hours  CBC:  Lab 11/11/11 0508 11/10/11 0420 11/09/11 1954  WBC 8.1 11.8* 12.8*  NEUTROABS -- -- 11.1*  HGB 9.5* 9.7* 9.9*  HCT 29.0* 28.5* 30.8*  MCV 96.0 94.4 96.3  PLT 290 332 328    Cardiac Enzymes: No results found for this basename: CKTOTAL:5,CKMB:5,CKMBINDEX:5,TROPONINI:5 in the last 168 hours BNP (last 3 results) No results found for this basename: PROBNP:3 in the last 8760 hours   CBG: No results found for this basename:  GLUCAP:5 in the last 168 hours  No results found for this or any previous visit (from the past 240 hour(s)).   Studies: Dg Pelvis 1-2 Views  11/09/2011  *RADIOLOGY REPORT*  Clinical Data: Fall.  Bilateral hip pain.  PELVIS - 1-2 VIEW  Comparison: None.  Findings: Bilateral total hip arthroplasties are in place.  Both devices are located and there is no fracture.  Asymmetric positioning of the femoral heads in the acetabular cup is consistent with wear of the linings of the cups.  IMPRESSION:  1.  No acute finding. 2.  Findings compatible with wear of the lining of the patient's acetabular cups.   Original Report Authenticated By: Bernadene Bell. Maricela Curet, M.D.    Dg Shoulder Right  11/09/2011  *RADIOLOGY REPORT*  Clinical Data: Fall, pain.  RIGHT SHOULDER - 2+ VIEW  Comparison: Plain films 09/04/2011.  Findings: There is no fracture or dislocation.  Postoperative change noted.  Glenohumeral degenerative disease is present. Imaged right lung and ribs are unremarkable.  IMPRESSION: No acute finding.   Original Report Authenticated By: Bernadene Bell. D'ALESSIO, M.D.    Dg Elbow Complete Left  10/30/2011  *RADIOLOGY REPORT*  Clinical Data: Recent fall  LEFT ELBOW - COMPLETE 3+ VIEW  Comparison: None  Findings: On the lateral view there is a small elbow joint effusion present somewhat displacing the anterior fat pad.  No definite radial fracture is seen.  There is however slight cortical irregularity of the coronoid process of the proximal ulna and a nondisplaced fracture is a consideration.  IMPRESSION: Small elbow effusion.  Suspect fracture possibly of the coronoid process of the proximal left ulna.  Clinically significant discrepancy from primary report, if provided: None   Original Report Authenticated By: Juline Patch, M.D.    Dg Forearm Left  10/30/2011  *RADIOLOGY REPORT*  Clinical Data: Larey Seat  LEFT FOREARM - 2 VIEW  Comparison: 02/15/2011  Findings: Fracture of the coronoid process of the ulna, distracted 1  mm and displaced distally 2- 3 mm.  Distal forearm unremarkable.  IMPRESSION:  1.  Fracture of the coronoid process of the ulna as above.  Clinically significant discrepancy from primary report, if provided: None   Original Report Authenticated By: Osa Craver, M.D.    Dg Wrist Complete Left  10/30/2011  *RADIOLOGY REPORT*  Clinical Data: Larey Seat  LEFT WRIST - COMPLETE 3+ VIEW  Comparison: None.  Findings: Carpal rows intact. Negative for fracture, dislocation, or other acute abnormality.  Normal alignment and mineralization. No significant degenerative change.  Regional soft tissues unremarkable.  IMPRESSION:  Negative  Clinically significant discrepancy from primary report, if provided: None   Original Report Authenticated By: Osa Craver, M.D.    Dg Hip Complete Left  10/26/2011  *RADIOLOGY REPORT*  Clinical Data: Recent fall, left hip pain  LEFT HIP - COMPLETE 2+ VIEW  Comparison: Pelvis left hip films of 02/15/2011  Findings: Bilateral total hip replacements are present.  The femoral and acetabular components appear unchanged in position on the frontal view.  The bones are diffusely osteopenic.  No acute fracture is seen.  There are degenerative changes noted in the lower lumbar spine.  IMPRESSION: Stable appearance of bilateral total hip replacements.  Diffuse osteopenia with degenerative change in the lower lumbar spine.  Original Report Authenticated By: Juline Patch, M.D.   Dg Femur Right  11/09/2011  *RADIOLOGY REPORT*  Clinical Data: Fall.  Pain.  RIGHT FEMUR - 2 VIEW  Comparison: Plain films right hip 09/03/2011.  Findings: Right total hip replacement is in place.  The device is located.  Periprosthetic fracture along the lateral margin of the proximal femur shows progressive healing.  No new fracture is identified.  There is a small knee joint effusion with degenerative change about the knee.  IMPRESSION:  1.  No acute finding. 2.  Healing incomplete periprosthetic fracture  proximal right femur.   Original Report Authenticated By: Bernadene Bell. Maricela Curet, M.D.    Ct Head Wo Contrast  11/09/2011  *RADIOLOGY REPORT*  Clinical Data:  Status post fall.  Pain is.  Right.  No free pain.  CT HEAD WITHOUT CONTRAST CT CERVICAL SPINE WITHOUT CONTRAST  Technique:  Multidetector CT imaging of the head and cervical spine was performed following the standard protocol without intravenous contrast.  Multiplanar CT image reconstructions of the cervical spine were also generated.  Comparison:  Head and cervical spine CT scan 10/26/2011.  CT HEAD  Findings: Marked cortical atrophy and extensive chronic microvascular ischemic change are again seen.  There is a scalp contusion over the left parietal bone, new since the prior study. No evidence of acute intracranial abnormality including infarction, hemorrhage, mass lesion, mass effect, midline shift or abnormal extra-axial fluid collection.  No hydrocephalus or pneumocephalus. Calvarium intact.  IMPRESSION:  1.  Scalp contusion over the left parietal bone.  No underlying fracture or acute intracranial abnormality. 2.  Marked atrophy and chronic microvascular ischemic change.  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  There is no fracture or subluxation of the cervical spine. Marked multilevel degenerative disease is unchanged in appearance. Lung apices are clear.  IMPRESSION: No acute finding.  Marked multilevel degenerative disease.   Original Report Authenticated By: Bernadene Bell. Maricela Curet, M.D.    Ct Head Wo Contrast  10/26/2011  *RADIOLOGY REPORT*  Clinical Data:  Larey Seat and hit head.  Neck pain.  Head pain.  CT HEAD WITHOUT CONTRAST CT CERVICAL SPINE WITHOUT CONTRAST  Technique:  Multidetector CT imaging of the head and cervical spine was performed following the standard protocol without intravenous contrast.  Multiplanar CT image reconstructions of the cervical spine were also generated.  Comparison:   MRI cervical spine 04/11/2011.  CT head cervical spine 03/16/2011.  CT HEAD  Findings: There is no evidence for acute infarction, intracranial hemorrhage, mass lesion, hydrocephalus, or extra-axial fluid. Moderate to severe atrophy is present.  Advanced chronic microvascular ischemic change is present in the periventricular and subcortical white matter.  The calvarium is intact.  There is moderate vascular calcification. There is no acute sinus or mastoid fluid.  There is a similar appearance to priors.  IMPRESSION: Atrophy and chronic microvascular ischemic change.  No skull fracture or intracranial hemorrhage.  CT CERVICAL SPINE  Findings: No visible cervical spine fracture or traumatic subluxation.  No prevertebral soft tissue swelling or visible intraspinal hematoma.  Trachea  midline.  Lung apices clear.  No upper rib fracture.  Advanced disc space narrowing C3-C7. Bilateral carotid calcification.  Multilevel facet arthropathy.  IMPRESSION: No visible cervical spine fracture or traumatic subluxation. Similar appearance to priors.  Original Report Authenticated By: Elsie Stain, M.D.   Ct Cervical Spine Wo Contrast  11/09/2011  *RADIOLOGY REPORT*  Clinical Data:  Status post fall.  Pain is.  Right.  No free pain.  CT HEAD WITHOUT CONTRAST CT CERVICAL SPINE WITHOUT CONTRAST  Technique:  Multidetector CT imaging of the head and cervical spine was performed following the standard protocol without intravenous contrast.  Multiplanar CT image reconstructions of the cervical spine were also generated.  Comparison:  Head and cervical spine CT scan 10/26/2011.  CT HEAD  Findings: Marked cortical atrophy and extensive chronic microvascular ischemic change are again seen.  There is a scalp contusion over the left parietal bone, new since the prior study. No evidence of acute intracranial abnormality including infarction, hemorrhage, mass lesion, mass effect, midline shift or abnormal extra-axial fluid collection.  No  hydrocephalus or pneumocephalus. Calvarium intact.  IMPRESSION:  1.  Scalp contusion over the left parietal bone.  No underlying fracture or acute intracranial abnormality. 2.  Marked atrophy and chronic microvascular ischemic change.  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  There is no fracture or subluxation of the cervical spine. Marked multilevel degenerative disease is unchanged in appearance. Lung apices are clear.  IMPRESSION: No acute finding.  Marked multilevel degenerative disease.   Original Report Authenticated By: Bernadene Bell. Maricela Curet, M.D.    Ct Cervical Spine Wo Contrast  10/26/2011  *RADIOLOGY REPORT*  Clinical Data:  Larey Seat and hit head.  Neck pain.  Head pain.  CT HEAD WITHOUT CONTRAST CT CERVICAL SPINE WITHOUT CONTRAST  Technique:  Multidetector CT imaging of the head and cervical spine was performed following the standard protocol without intravenous contrast.  Multiplanar CT image reconstructions of the cervical spine were also generated.  Comparison:  MRI cervical spine 04/11/2011.  CT head cervical spine 03/16/2011.  CT HEAD  Findings: There is no evidence for acute infarction, intracranial hemorrhage, mass lesion, hydrocephalus, or extra-axial fluid. Moderate to severe atrophy is present.  Advanced chronic microvascular ischemic change is present in the periventricular and subcortical white matter.  The calvarium is intact.  There is moderate vascular calcification. There is no acute sinus or mastoid fluid.  There is a similar appearance to priors.  IMPRESSION: Atrophy and chronic microvascular ischemic change.  No skull fracture or intracranial hemorrhage.  CT CERVICAL SPINE  Findings: No visible cervical spine fracture or traumatic subluxation.  No prevertebral soft tissue swelling or visible intraspinal hematoma.  Trachea midline.  Lung apices clear.  No upper rib fracture.  Advanced disc space narrowing C3-C7. Bilateral carotid  calcification.  Multilevel facet arthropathy.  IMPRESSION: No visible cervical spine fracture or traumatic subluxation. Similar appearance to priors.  Original Report Authenticated By: Elsie Stain, M.D.   Dg Shoulder Left  10/30/2011  *RADIOLOGY REPORT*  Clinical Data: Larey Seat  LEFT SHOULDER - 2+ VIEW  Comparison: 03/16/2011  Findings: Degenerative changes at the [employer] joint as before.  There is a somewhat high-riding left humeral head as before.  Negative for fracture or dislocation.  IMPRESSION:  Stable degenerative changes without fracture or dislocation.  Clinically significant discrepancy from primary report, if provided: None   Original Report Authenticated By: Osa Craver, M.D.  Scheduled Meds:    . aspirin EC  81 mg Oral Daily  . azithromycin  500 mg Oral Daily  . donepezil  10 mg Oral QHS  . finasteride  5 mg Oral Daily  . levothyroxine  50 mcg Oral Daily  . lisinopril  5 mg Oral Daily  . loratadine  10 mg Oral Daily  . memantine  10 mg Oral Daily  . sertraline  50 mg Oral Daily  . Tamsulosin HCl  0.4 mg Oral Daily  . terazosin  2 mg Oral QHS   Continuous Infusions:   Active Problems:  HTN (hypertension), benign  Dementia  BPH (benign prostatic hyperplasia)  Encephalopathy acute  Fall  Laceration of head  Debility    Time spent: 30 minutes   [facility] C  [facility] Pager [phone]. If 8PM-8AM, please contact night-coverage at [email or web address], password [facility] 11/12/2011, 10:30 AM  LOS: 3 days

## 2011-11-13 MED ORDER — SODIUM CHLORIDE 0.9 % IV SOLN: INTRAVENOUS | Status: DC

## 2011-11-13 MED ORDER — SODIUM CHLORIDE 0.9 % IV SOLN
INTRAVENOUS | Status: DC
  Administered 2011-11-13: 23:00:00 via INTRAVENOUS
  Administered 2011-11-14: 100 mL/h via INTRAVENOUS
  Administered 2011-11-14: 100 mL via INTRAVENOUS

## 2011-11-13 MED ORDER — SODIUM CHLORIDE 0.9 % IV BOLUS (SEPSIS)
250.0000 mL | Freq: Once | INTRAVENOUS | Status: AC
  Administered 2011-11-13: 250 mL via INTRAVENOUS

## 2011-11-13 MED ORDER — SODIUM CHLORIDE 0.9 % IV BOLUS (SEPSIS)
500.0000 mL | Freq: Once | INTRAVENOUS | Status: AC
  Administered 2011-11-13: 500 mL via INTRAVENOUS

## 2011-11-13 NOTE — Progress Notes (Signed)
Pt was too weak this am to do orthostatic vital signs. Will continue to monitor the pt. Roosvelt Maser, RN

## 2011-11-13 NOTE — Progress Notes (Signed)
[facility] PROGRESS NOTE  Robert Preston XWR:[id number] DOB: [birth year] DOA: 11/09/2011 PCP: Nadean Corwin, MD  Assessment/Plan: Active Problems:  HTN (hypertension), benign  Dementia  BPH (benign prostatic hyperplasia)  Encephalopathy acute  Fall  Laceration of head  Debility  1. Recurrent falls,Multifactorial patient has a head laceration that was sutured in the ER. According to the family the patient is fallen  9 times this year. He had a periprosthetic fracture, and is still healing from that. He was in acute rehabilitation for 6 weeks and was discharged 2 weeks ago. Awaiting SNF 2. Recent episode of acute bronchitis - clinically improved, chest x-ray with no acute findings, continue Z-Pak. 3.   Recurrent syncope full workup including a 2-D echo which shows an EF of 55-60% with mild aortic valve stenosis., carotid Doppler shows left ICA stenosis 40-59%, MRI could not be done because of staples. Doubt TIA but continue aspirin 81 mg daily.  -still mildly hypotensive off lisinopril and hytrin, will bolus/hydrate with IVF and follow Status: full Family Communication: family at bedside Disposition Plan:  Awaiting SNF    Brief narrative: 78 year old gentleman who lives at home, he was attempting to go to the restroom with his walker, when he had a mechanical fall and fell onto the tub. It was an unwitnessed fall, his wife was in the next room. He did hit his head and sustained a laceration   Consultants:   none  Procedures:   none  Antibiotics:   start azithromycin  HPI/Subjective: Alert and conversant, denies any c/o. Family at bedside   Objective: Filed Vitals:   11/13/11 0600 11/13/11 0824 11/13/11 0828 11/13/11 0832  BP: 153/84 124/64 101/64 91/58  Pulse: 82 71 103 106  Temp: 98.3 F (36.8 C)     TempSrc: Oral     Resp: 18     Height:      Weight:      SpO2: 96%       Intake/Output Summary (Last 24 hours) at 11/13/11 1221 Last data filed at  11/12/11 1300  Gross per 24 hour  Intake      0 ml  Output    400 ml  Net   -400 ml    Exam:  In Gen: alert and oriented x1, appropriate Head: L parietal area laceration.  Nose: Nose normal.  Mouth/Throat: Oropharynx is clear and moist.  Eyes: Conjunctivae are normal. Pupils are equal, round, and reactive to light. No scleral icterus.  Neck: Neck supple. No tracheal deviation present.  Cardiovascular: Normal rate, regular rhythm, normal heart sounds and intact distal pulses.  Pulmonary/Chest: Effort normal and breath sounds normal. No respiratory distress.  Abdominal: Soft. Normal appearance and bowel sounds are normal. She exhibits no distension. There is no tenderness.  Extremitiesl: No edema, no cyanosis and.  Neurological: She is alert. No cranial nerve deficit.    Data Reviewed: Basic Metabolic Panel:  Lab 11/11/11 1914 11/10/11 0420 11/09/11 1954  NA 139 141 138  K 3.9 3.3* 4.0  CL 103 105 101  CO2 28 28 29   GLUCOSE 94 101* 189*  BUN 21 19 23   CREATININE 1.08 0.96 1.05  CALCIUM 9.1 9.1 9.1  MG -- 2.0 --  PHOS -- -- --    Liver Function Tests: No results found for this basename: AST:5,ALT:5,ALKPHOS:5,BILITOT:5,PROT:5,ALBUMIN:5 in the last 168 hours No results found for this basename: LIPASE:5,AMYLASE:5 in the last 168 hours No results found for this basename: AMMONIA:5 in the last 168 hours  CBC:  Lab 11/11/11 0508 11/10/11 0420 11/09/11 1954  WBC 8.1 11.8* 12.8*  NEUTROABS -- -- 11.1*  HGB 9.5* 9.7* 9.9*  HCT 29.0* 28.5* 30.8*  MCV 96.0 94.4 96.3  PLT 290 332 328    Cardiac Enzymes: No results found for this basename: CKTOTAL:5,CKMB:5,CKMBINDEX:5,TROPONINI:5 in the last 168 hours BNP (last 3 results) No results found for this basename: PROBNP:3 in the last 8760 hours   CBG: No results found for this basename: GLUCAP:5 in the last 168 hours  No results found for this or any previous visit (from the past 240 hour(s)).   Studies: Dg Pelvis 1-2  Views  11/09/2011  *RADIOLOGY REPORT*  Clinical Data: Fall.  Bilateral hip pain.  PELVIS - 1-2 VIEW  Comparison: None.  Findings: Bilateral total hip arthroplasties are in place.  Both devices are located and there is no fracture.  Asymmetric positioning of the femoral heads in the acetabular cup is consistent with wear of the linings of the cups.  IMPRESSION:  1.  No acute finding. 2.  Findings compatible with wear of the lining of the patient's acetabular cups.   Original Report Authenticated By: Bernadene Bell. Maricela Curet, M.D.    Dg Shoulder Right  11/09/2011  *RADIOLOGY REPORT*  Clinical Data: Fall, pain.  RIGHT SHOULDER - 2+ VIEW  Comparison: Plain films 09/04/2011.  Findings: There is no fracture or dislocation.  Postoperative change noted.  Glenohumeral degenerative disease is present. Imaged right lung and ribs are unremarkable.  IMPRESSION: No acute finding.   Original Report Authenticated By: Bernadene Bell. D'ALESSIO, M.D.    Dg Elbow Complete Left  10/30/2011  *RADIOLOGY REPORT*  Clinical Data: Recent fall  LEFT ELBOW - COMPLETE 3+ VIEW  Comparison: None  Findings: On the lateral view there is a small elbow joint effusion present somewhat displacing the anterior fat pad.  No definite radial fracture is seen.  There is however slight cortical irregularity of the coronoid process of the proximal ulna and a nondisplaced fracture is a consideration.  IMPRESSION: Small elbow effusion.  Suspect fracture possibly of the coronoid process of the proximal left ulna.  Clinically significant discrepancy from primary report, if provided: None   Original Report Authenticated By: Juline Patch, M.D.    Dg Forearm Left  10/30/2011  *RADIOLOGY REPORT*  Clinical Data: Larey Seat  LEFT FOREARM - 2 VIEW  Comparison: 02/15/2011  Findings: Fracture of the coronoid process of the ulna, distracted 1 mm and displaced distally 2- 3 mm.  Distal forearm unremarkable.  IMPRESSION:  1.  Fracture of the coronoid process of the ulna as above.   Clinically significant discrepancy from primary report, if provided: None   Original Report Authenticated By: Osa Craver, M.D.    Dg Wrist Complete Left  10/30/2011  *RADIOLOGY REPORT*  Clinical Data: Larey Seat  LEFT WRIST - COMPLETE 3+ VIEW  Comparison: None.  Findings: Carpal rows intact. Negative for fracture, dislocation, or other acute abnormality.  Normal alignment and mineralization. No significant degenerative change.  Regional soft tissues unremarkable.  IMPRESSION:  Negative  Clinically significant discrepancy from primary report, if provided: None   Original Report Authenticated By: Thora Lance III, M.D.    Dg Hip Complete Left  10/26/2011  *RADIOLOGY REPORT*  Clinical Data: Recent fall, left hip pain  LEFT HIP - COMPLETE 2+ VIEW  Comparison: Pelvis left hip films of 02/15/2011  Findings: Bilateral total hip replacements are present.  The femoral and acetabular components appear unchanged in position on the frontal  view.  The bones are diffusely osteopenic.  No acute fracture is seen.  There are degenerative changes noted in the lower lumbar spine.  IMPRESSION: Stable appearance of bilateral total hip replacements.  Diffuse osteopenia with degenerative change in the lower lumbar spine.  Original Report Authenticated By: Juline Patch, M.D.   Dg Femur Right  11/09/2011  *RADIOLOGY REPORT*  Clinical Data: Fall.  Pain.  RIGHT FEMUR - 2 VIEW  Comparison: Plain films right hip 09/03/2011.  Findings: Right total hip replacement is in place.  The device is located.  Periprosthetic fracture along the lateral margin of the proximal femur shows progressive healing.  No new fracture is identified.  There is a small knee joint effusion with degenerative change about the knee.  IMPRESSION:  1.  No acute finding. 2.  Healing incomplete periprosthetic fracture proximal right femur.   Original Report Authenticated By: Bernadene Bell. Maricela Curet, M.D.    Ct Head Wo Contrast  11/09/2011  *RADIOLOGY REPORT*   Clinical Data:  Status post fall.  Pain is.  Right.  No free pain.  CT HEAD WITHOUT CONTRAST CT CERVICAL SPINE WITHOUT CONTRAST  Technique:  Multidetector CT imaging of the head and cervical spine was performed following the standard protocol without intravenous contrast.  Multiplanar CT image reconstructions of the cervical spine were also generated.  Comparison:  Head and cervical spine CT scan 10/26/2011.  CT HEAD  Findings: Marked cortical atrophy and extensive chronic microvascular ischemic change are again seen.  There is a scalp contusion over the left parietal bone, new since the prior study. No evidence of acute intracranial abnormality including infarction, hemorrhage, mass lesion, mass effect, midline shift or abnormal extra-axial fluid collection.  No hydrocephalus or pneumocephalus. Calvarium intact.  IMPRESSION:  1.  Scalp contusion over the left parietal bone.  No underlying fracture or acute intracranial abnormality. 2.  Marked atrophy and chronic microvascular ischemic change.  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  There is no fracture or subluxation of the cervical spine. Marked multilevel degenerative disease is unchanged in appearance. Lung apices are clear.  IMPRESSION: No acute finding.  Marked multilevel degenerative disease.   Original Report Authenticated By: Bernadene Bell. Maricela Curet, M.D.    Ct Head Wo Contrast  10/26/2011  *RADIOLOGY REPORT*  Clinical Data:  Larey Seat and hit head.  Neck pain.  Head pain.  CT HEAD WITHOUT CONTRAST CT CERVICAL SPINE WITHOUT CONTRAST  Technique:  Multidetector CT imaging of the head and cervical spine was performed following the standard protocol without intravenous contrast.  Multiplanar CT image reconstructions of the cervical spine were also generated.  Comparison:  MRI cervical spine 04/11/2011.  CT head cervical spine 03/16/2011.  CT HEAD  Findings: There is no evidence for acute infarction, intracranial  hemorrhage, mass lesion, hydrocephalus, or extra-axial fluid. Moderate to severe atrophy is present.  Advanced chronic microvascular ischemic change is present in the periventricular and subcortical white matter.  The calvarium is intact.  There is moderate vascular calcification. There is no acute sinus or mastoid fluid.  There is a similar appearance to priors.  IMPRESSION: Atrophy and chronic microvascular ischemic change.  No skull fracture or intracranial hemorrhage.  CT CERVICAL SPINE  Findings: No visible cervical spine fracture or traumatic subluxation.  No prevertebral soft tissue swelling or visible intraspinal hematoma.  Trachea midline.  Lung apices clear.  No upper rib fracture.  Advanced disc space narrowing C3-C7. Bilateral carotid calcification.  Multilevel facet arthropathy.  IMPRESSION: No visible cervical spine fracture or traumatic subluxation. Similar appearance to priors.  Original Report Authenticated By: Elsie Stain, M.D.   Ct Cervical Spine Wo Contrast  11/09/2011  *RADIOLOGY REPORT*  Clinical Data:  Status post fall.  Pain is.  Right.  No free pain.  CT HEAD WITHOUT CONTRAST CT CERVICAL SPINE WITHOUT CONTRAST  Technique:  Multidetector CT imaging of the head and cervical spine was performed following the standard protocol without intravenous contrast.  Multiplanar CT image reconstructions of the cervical spine were also generated.  Comparison:  Head and cervical spine CT scan 10/26/2011.  CT HEAD  Findings: Marked cortical atrophy and extensive chronic microvascular ischemic change are again seen.  There is a scalp contusion over the left parietal bone, new since the prior study. No evidence of acute intracranial abnormality including infarction, hemorrhage, mass lesion, mass effect, midline shift or abnormal extra-axial fluid collection.  No hydrocephalus or pneumocephalus. Calvarium intact.  IMPRESSION:  1.  Scalp contusion over the left parietal bone.  No underlying fracture or  acute intracranial abnormality. 2.  Marked atrophy and chronic microvascular ischemic change.  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  There is no fracture or subluxation of the cervical spine. Marked multilevel degenerative disease is unchanged in appearance. Lung apices are clear.  IMPRESSION: No acute finding.  Marked multilevel degenerative disease.   Original Report Authenticated By: Bernadene Bell. Maricela Curet, M.D.    Ct Cervical Spine Wo Contrast  10/26/2011  *RADIOLOGY REPORT*  Clinical Data:  Larey Seat and hit head.  Neck pain.  Head pain.  CT HEAD WITHOUT CONTRAST CT CERVICAL SPINE WITHOUT CONTRAST  Technique:  Multidetector CT imaging of the head and cervical spine was performed following the standard protocol without intravenous contrast.  Multiplanar CT image reconstructions of the cervical spine were also generated.  Comparison:  MRI cervical spine 04/11/2011.  CT head cervical spine 03/16/2011.  CT HEAD  Findings: There is no evidence for acute infarction, intracranial hemorrhage, mass lesion, hydrocephalus, or extra-axial fluid. Moderate to severe atrophy is present.  Advanced chronic microvascular ischemic change is present in the periventricular and subcortical white matter.  The calvarium is intact.  There is moderate vascular calcification. There is no acute sinus or mastoid fluid.  There is a similar appearance to priors.  IMPRESSION: Atrophy and chronic microvascular ischemic change.  No skull fracture or intracranial hemorrhage.  CT CERVICAL SPINE  Findings: No visible cervical spine fracture or traumatic subluxation.  No prevertebral soft tissue swelling or visible intraspinal hematoma.  Trachea midline.  Lung apices clear.  No upper rib fracture.  Advanced disc space narrowing C3-C7. Bilateral carotid calcification.  Multilevel facet arthropathy.  IMPRESSION: No visible cervical spine fracture or traumatic subluxation. Similar appearance to  priors.  Original Report Authenticated By: Elsie Stain, M.D.   Dg Shoulder Left  10/30/2011  *RADIOLOGY REPORT*  Clinical Data: Larey Seat  LEFT SHOULDER - 2+ VIEW  Comparison: 03/16/2011  Findings: Degenerative changes at the [facility] joint as before.  There is a somewhat high-riding left humeral head as before.  Negative for fracture or dislocation.  IMPRESSION:  Stable degenerative changes without fracture or dislocation.  Clinically significant discrepancy from primary report, if provided: None   Original Report Authenticated By: Thora Lance III, M.D.     Scheduled Meds:    . aspirin EC  81 mg Oral Daily  . azithromycin  500 mg  Oral Daily  . donepezil  10 mg Oral QHS  . finasteride  5 mg Oral Daily  . levothyroxine  50 mcg Oral Daily  . loratadine  10 mg Oral Daily  . memantine  10 mg Oral Daily  . sertraline  50 mg Oral Daily  . sodium chloride  500 mL Intravenous Once  . Tamsulosin HCl  0.4 mg Oral Daily  . DISCONTD: lisinopril  5 mg Oral Daily  . DISCONTD: terazosin  2 mg Oral QHS   Continuous Infusions:    . sodium chloride      Active Problems:  HTN (hypertension), benign  Dementia  BPH (benign prostatic hyperplasia)  Encephalopathy acute  Fall  Laceration of head  Debility    Time spent: 30 minutes   [facility]  [facility] Pager [phone]. If 8PM-8AM, please contact night-coverage at [email or web address], password [facility] 11/13/2011, 12:21 PM  LOS: 4 days

## 2011-11-13 NOTE — Evaluation (Signed)
Occupational Therapy Evaluation Patient Details Name: Robert Preston MRN: [id number] DOB: 26-Oct-1922 Today's Date: 11/13/2011 Time: 4098-1191 OT Time Calculation (min): 20 min  OT Assessment / Plan / Recommendation Clinical Impression  78 yo male admitted with acute encephalopathy, falls at home. Pt confused but participated well with session.  Required Min-Mod assist for functional mobility but max assist for LB dressing. Pt will benefit from skilled OT services to improve ADL independence.     OT Assessment  Patient needs continued OT Services    Follow Up Recommendations  Skilled nursing facility    Barriers to Discharge      Equipment Recommendations  Other (comment) (need to confirm all DME with wife. daughter states has RW)    Recommendations for Other Services    Frequency  Min 2X/week    Precautions / Restrictions Precautions Precautions: Fall        ADL  Eating/Feeding: Simulated;Independent Where Assessed - Eating/Feeding: Chair Grooming: Simulated;Wash/dry hands;Set up;Supervision/safety Where Assessed - Grooming: Supported sitting Upper Body Bathing: Simulated;Chest;Right arm;Left arm;Abdomen;Supervision/safety;Set up Where Assessed - Upper Body Bathing: Unsupported sitting Lower Body Bathing: Simulated;Moderate assistance Where Assessed - Lower Body Bathing: Supported sit to stand Upper Body Dressing: Simulated;Minimal assistance Where Assessed - Upper Body Dressing: Unsupported sitting Lower Body Dressing: Simulated;Maximal assistance Where Assessed - Lower Body Dressing: Supported sit to stand Toilet Transfer: Simulated;Minimal assistance;Moderate assistance Toilet Transfer Method: Sit to stand Toileting - Clothing Manipulation and Hygiene: Simulated;Moderate assistance Where Assessed - Toileting Clothing Manipulation and Hygiene: Standing Equipment Used: Rolling walker ADL Comments: Pt easily distracted and begins talking about other things than related  to question asked. Pt shaky in UEs with standing. Pt does report feeling dizzy with standing at chair so didnt go further.     OT Diagnosis: Generalized weakness  OT Problem List: Decreased strength;Impaired balance (sitting and/or standing);Decreased activity tolerance;Decreased knowledge of use of DME or AE OT Treatment Interventions: Self-care/ADL training;Therapeutic activities;DME and/or AE instruction;Patient/family education   OT Goals Acute Rehab OT Goals OT Goal Formulation: Patient unable to participate in goal setting Time For Goal Achievement: 11/27/11 Potential to Achieve Goals: Good ADL Goals Pt Will Perform Grooming: with min assist;Standing at sink ADL Goal: Grooming - Progress: Goal set today Pt Will Perform Lower Body Bathing: with min assist;Sit to stand from bed;Sit to stand from chair ADL Goal: Lower Body Bathing - Progress: Goal set today Pt Will Perform Lower Body Dressing: with min assist;Sit to stand from bed;Sit to stand from chair ADL Goal: Lower Body Dressing - Progress: Goal set today Pt Will Transfer to Toilet: with min assist;Ambulation;with DME;3-in-1 ADL Goal: Toilet Transfer - Progress: Goal set today Pt Will Perform Toileting - Clothing Manipulation: with min assist;Standing ADL Goal: Toileting - Clothing Manipulation - Progress: Goal set today  Visit Information  Last OT Received On: 11/13/11 Assistance Needed: +2 (safety)    Subjective Data  Subjective: I can do whatever you need me to do Patient Stated Goal: agreeable to OT   Prior Functioning  Vision/Perception  Home Living Lives With: Spouse Available Help at Discharge: Personal care attendant (has an aide 2 hours per day per chart & family) Home Adaptive Equipment: Walker - rolling Additional Comments: Daughter present and states pt used a RW PTA. Has an aide they hired when pt returned home to help with bathing and dressing. She wasnt sure of any other DME owned.  Prior Function Level  of Independence: Needs assistance Needs Assistance: Bathing;Dressing;Toileting Communication Communication: No difficulties  Cognition  Overall Cognitive Status: History of cognitive impairments - at baseline Arousal/Alertness: Awake/alert Orientation Level: Disoriented to;Place;Time;Situation Behavior During Session: WFL for tasks performed    Extremity/Trunk Assessment Right Upper Extremity Assessment RUE ROM/Strength/Tone: [facility] for tasks assessed Left Upper Extremity Assessment LUE ROM/Strength/Tone: Deficits LUE ROM/Strength/Tone Deficits: about 140 degrees shoulder flexion. Pt states his L UE isnt as good as the R.    Mobility  Shoulder Instructions  Transfers Transfers: Sit to Stand;Stand to Sit Sit to Stand: 4: Min assist;3: Mod assist;With upper extremity assist;From chair/3-in-1 Stand to Sit: 3: Mod assist;4: Min assist;With upper extremity assist;To chair/3-in-1 Details for Transfer Assistance: cues for hand placement, increased time, and assist to rise, stabilize and contol descent to chair.       Exercise     Balance     End of Session OT - End of Session Activity Tolerance: Other (comment) (pt states dizzy standing from chair. ) Patient left: in chair;with call bell/phone within reach;with chair alarm set  GO Functional Assessment Tool Used: clinical judgement Functional Limitation: Self care Self Care Current Status (J4782): At least 40 percent but less than 60 percent impaired, limited or restricted Self Care Goal Status (N5621): At least 1 percent but less than 20 percent impaired, limited or restricted   Lennox Laity [phone] 11/13/2011, 1:06 PM

## 2011-11-13 NOTE — Progress Notes (Signed)
Assumed care from previous RN; agree with her assessment.  Pt asleep, resp even and unlabored.  No s/s of acute distress.  Bed alarm on for pt safety.  Close monitoring.

## 2011-11-13 NOTE — Progress Notes (Signed)
11/10/11 1143  PT G-Codes **NOT FOR INPATIENT CLASS**  Functional Assessment Tool Used Clinical judgement through chart review from original assessment  Functional Limitation Mobility: Walking and moving around  Mobility: Walking and Moving Around Current Status (Z6109) CJ  Mobility: Walking and Moving Around Goal Status (U0454) CI  PT General Charges  $$ ACUTE PT VISIT 1 Procedure  PT Evaluation  $Initial PT Evaluation Tier II 1 Procedure   G-codes for original assessment as performed by Rebeca Alert on 11/10/2011. Marella Bile, PT Pager: [phone] 11/13/2011

## 2011-11-14 LAB — BASIC METABOLIC PANEL
BUN: 22 mg/dL (ref 6–23)
CO2: 29 mEq/L (ref 19–32)
Calcium: 9.1 mg/dL (ref 8.4–10.5)
Creatinine, Ser: 0.93 mg/dL (ref 0.50–1.35)
GFR calc non Af Amer: 73 mL/min — ABNORMAL LOW (ref >90)
Glucose, Bld: 90 mg/dL (ref 70–99)

## 2011-11-14 LAB — CBC
MCH: 31.3 pg (ref 26.0–34.0)
MCHC: 32.8 g/dL (ref 30.0–36.0)
MCV: 95.6 fL (ref 78.0–100.0)
Platelets: 295 10*3/uL (ref 150–400)
RDW: 13.4 % (ref 11.5–15.5)

## 2011-11-14 MED ORDER — ACETAMINOPHEN 325 MG PO TABS: 650.0000 mg | ORAL_TABLET | Freq: Four times a day (QID) | ORAL | Status: DC | PRN

## 2011-11-14 MED ORDER — SODIUM CHLORIDE 0.9 % IV BOLUS (SEPSIS)
500.0000 mL | Freq: Once | INTRAVENOUS | Status: AC
  Administered 2011-11-14: 500 mL via INTRAVENOUS

## 2011-11-14 MED ORDER — SENNOSIDES-DOCUSATE SODIUM 8.6-50 MG PO TABS: 1.0000 | ORAL_TABLET | Freq: Every evening | ORAL | Status: DC | PRN

## 2011-11-14 MED ORDER — HYDROCODONE-ACETAMINOPHEN 5-325 MG PO TABS
1.0000 | ORAL_TABLET | ORAL | Status: AC | PRN
Start: 2011-11-14 — End: 2011-11-24

## 2011-11-14 NOTE — Progress Notes (Signed)
Patient has a bed at [facility] today. Daughter, Graciella Belton made aware & on her way to hospital now. CSW text-paged Dr. Suanne Marker to make aware. Anticipating discharge today.   Unice Bailey, LCSW [facility] Clinical Social Worker cell #: [phone]

## 2011-11-14 NOTE — Progress Notes (Signed)
Physical Therapy Treatment Patient Details Name: Robert Preston MRN: [id number] DOB: Mar 04, 1923 Today's Date: 11/14/2011 Time: 4098-1191 PT Time Calculation (min): 28 min  PT Assessment / Plan / Recommendation Comments on Treatment Session  Pt. much improved in mobility today. pt ambulated 200' with 2 person's. Pt. was quite animated  today, less confused. followed commands much better.    Follow Up Recommendations  Skilled nursing facility    Barriers to Discharge        Equipment Recommendations  Defer to next venue    Recommendations for Other Services OT consult  Frequency Min 3X/week   Plan Discharge plan remains appropriate;Frequency remains appropriate    Precautions / Restrictions Precautions Precautions: Fall   Pertinent Vitals/Pain     Mobility  Bed Mobility Supine to Sit: 3: Mod assist Details for Bed Mobility Assistance: Multimodal cues for safety, technique,hand placement. Assist for bil LEs off bed and trunk to upright Transfers Transfers: Sit to Stand;Stand to Sit Sit to Stand: 3: Mod assist;From bed;With upper extremity assist Sit to Stand: Patient Percentage: 40% Stand to Sit: 4: Min assist;To chair/3-in-1;With upper extremity assist Details for Transfer Assistance: Pt. w/ less retropulsion today during sit to stand. able to keep feet under knees. Ambulation/Gait Ambulation/Gait Assistance: 1: +2 Total assist Ambulation/Gait: Patient Percentage: 60% Ambulation Distance (Feet): 200 Feet Assistive device: Rolling walker Ambulation/Gait Assistance Details: tactile cues for weight shifting, required less support today. pt was able to forward progress  Gait Pattern: Step-through pattern;Decreased step length - right;Decreased step length - left;Trunk flexed;Festinating;Left circumduction Gait velocity: decreased    Exercises     PT Diagnosis:    PT Problem List:   PT Treatment Interventions:     PT Goals Acute Rehab PT Goals Pt will go Supine/Side to  Sit: with supervision PT Goal: Supine/Side to Sit - Progress: Progressing toward goal Pt will go Sit to Supine/Side: with supervision PT Goal: Sit to Supine/Side - Progress: Progressing toward goal Pt will go Sit to Stand: with supervision PT Goal: Sit to Stand - Progress: Progressing toward goal Pt will Ambulate: 51 - 150 feet;with supervision;with least restrictive assistive device PT Goal: Ambulate - Progress: Progressing toward goal  Visit Information  Last PT Received On: 11/14/11 Assistance Needed: +2    Subjective Data  Subjective: I can get up, just watch me   Cognition  Overall Cognitive Status: History of cognitive impairments - at baseline Arousal/Alertness: Lethargic (pt. perked up w/ stimulation,) Orientation Level: Place;Time;Situation Behavior During Session: [facility] for tasks performed Cognition - Other Comments: quite animated today.    Balance  Static Sitting Balance Static Sitting - Balance Support: Feet supported Static Sitting - Level of Assistance: 5: Stand by assistance Static Standing Balance Static Standing - Balance Support: Bilateral upper extremity supported Static Standing - Level of Assistance: 4: Min assist  End of Session PT - End of Session Equipment Utilized During Treatment: Gait belt Activity Tolerance: Patient tolerated treatment well Patient left: in chair;with call bell/phone within reach;with chair alarm set Nurse Communication: Mobility status   GP Mobility: Walking and Moving Around Discharge Status [phone]): At least 40 percent but less than 60 percent impaired, limited or restricted   Rada Hay 11/14/2011, 1:38 PM [phone]

## 2011-11-14 NOTE — Care Management Note (Signed)
    Page 1 of 2   11/14/2011     5:13:34 PM   CARE MANAGEMENT NOTE 11/14/2011  Patient:  Robert Preston, Robert Preston   Account Number:  [id number]  Date Initiated:  11/10/2011  Documentation initiated by:  Jiles Crocker  Subjective/Objective Assessment:   ADMITTED WITH FALL, ENCEPHALOPATHY     Action/Plan:   PCP: Nadean Corwin, MD  LIVES AT HOME WITH SPOUSE WITH HHC PROVIDED BY [facility]   Anticipated DC Date:  11/11/2011   Anticipated DC Plan:  HOME W HOME HEALTH SERVICES  In-house referral  Clinical Social Worker      DC Planning Services  CM consult      [facility] Choice  NA   Choice offered to / List presented to:  NA   DME arranged  NA      DME agency  NA     HH arranged  NA      HH agency  NA   Status of service:  Completed, signed off Medicare Important Message given?  NA - LOS <3 / Initial given by admissions (If response is "NO", the following Medicare IM given date fields will be blank) Date Medicare IM given:   Date Additional Medicare IM given:    Discharge Disposition:  SKILLED NURSING FACILITY  Per UR Regulation:  Reviewed for med. necessity/level of care/duration of stay  If discussed at Long Length of Stay Meetings, dates discussed:    Comments:  11/14/2011 Raynelle Bring BSN CCM [phone] Current plans are for SNF upon discharge; CSW referraL  11/10/2011- PATIENT ADMITTED AS OBSERVATIONAL STATUS; KELLY SOC WORKER MADE AWARE/ PATIENT WITH POSSIBLE SNF PLACEMENT; B CHANDLER RN, BSN, MHA

## 2011-11-14 NOTE — Discharge Summary (Signed)
Physician Discharge Summary  Robert Preston UJW:[id number] DOB: [birth year] DOA: 11/09/2011  PCP: Nadean Corwin, MD  Admit date: 11/09/2011 Discharge date: 11/14/2011  Recommendations for Outpatient Follow-up: Follow-up Information    Please follow up. (SNF MD in 1-2days)          Discharge Diagnoses:  Active Problems:  HTN (hypertension), benign  Dementia  BPH (benign prostatic hyperplasia)  Encephalopathy acute  Fall  Laceration of head  Debility   Discharge Condition: Improved/stable  Diet recommendation: Heart healthy diet  Filed Weights   11/10/11 0556 11/12/11 0627  Weight: 78.6 kg (173 lb 4.5 oz) 78.382 kg (172 lb 12.8 oz)    History of present illness:  78 year old gentleman who lives at home, he was attempting to go to the restroom with his walker, when he had a mechanical fall and fell onto the tub. It was an unwitnessed fall, his wife was in the next room. He did hit his head and sustained a laceration. He was admitted for further evaluation and management.      Hospital Course by problem list:    1. Debility/Recurrent falls-Multifactorial patient has a head laceration that was sutured in the ER. According to the family the patient is fallen 9 times this year. He had a periprosthetic fracture, and is still healing from that. He was in acute rehabilitation for 6 weeks and was discharged 2 weeks ago. PT OT was consulted and followed patient in the hospital and recommended skilled nursing. 2. Recent episode of acute bronchitis - clinically improved, chest x-ray with no acute findings, continue Z-Pak. 3. Recurrent syncope-possibly secondary to orthostatic hypotension. full workup including a 2-D echo which shows an EF of 55-60% with mild aortic valve stenosis., carotid Doppler shows left ICA stenosis 40-59%, MRI could not be done because of staples. Doubt TIA but patient was started on aspirin which his to continue upon discharge. Orthostatic vitals were done and  he was found to be orthostatic and hydrated with IV fluids, his antihypertensives-lisinopril and Hytrin were also discontinued as he was mildly hypotensive which resolved with the mentioned interventions. 4.H/o dementia-he is to continue his outpatient medications upon discharge.   Procedures: none Consultations:  None  Discharge Exam: Filed Vitals:   11/14/11 0808  BP: 135/62  Pulse: 81  Temp:   Resp:    Filed Vitals:   11/13/11 2146 11/14/11 0806 11/14/11 0807 11/14/11 0808  BP: 137/68 157/65 143/72 135/62  Pulse: 65 64 73 81  Temp: 98.7 F (37.1 C) 98.3 F (36.8 C)    TempSrc: Oral Oral    Resp: 18 18    Height:      Weight:      SpO2: 96% 97%     Exam:  In Gen: alert and oriented x1, appropriate  Head: L parietal area laceration.  Nose: Nose normal.  Mouth/Throat: Oropharynx is clear and moist.  Eyes: Conjunctivae are normal. Pupils are equal, round, and reactive to light. No scleral icterus.  Neck: Neck supple. No tracheal deviation present.  Cardiovascular: Normal rate, regular rhythm, normal heart sounds and intact distal pulses.  Pulmonary/Chest: Effort normal and breath sounds normal. No respiratory distress.  Abdominal: Soft. Normal appearance and bowel sounds are normal. She exhibits no distension. There is no tenderness.  Extremities: No edema, no cyanosis and.  Neurological: he is alert and answers simple questions appropriarely. No cranial nerve deficit.     Discharge Instructions  Discharge Orders    Future Orders Please Complete By Expires  Diet Heart      Increase activity slowly        Medication List  As of 11/14/2011  1:29 PM   STOP taking these medications         terazosin 2 MG capsule         TAKE these medications         acetaminophen 325 MG tablet   Commonly known as: TYLENOL   Take 2 tablets (650 mg total) by mouth every 6 (six) hours as needed for pain or fever (or Fever >/= 101).      aspirin EC 81 MG tablet   Take 81 mg  by mouth daily.      donepezil 10 MG tablet   Commonly known as: ARICEPT   Take 10 mg by mouth at bedtime.      finasteride 5 MG tablet   Commonly known as: PROSCAR   Take 5 mg by mouth daily.      HYDROcodone-acetaminophen 5-325 MG per tablet   Commonly known as: NORCO/VICODIN   Take 1-2 tablets by mouth every 4 (four) hours as needed.      levothyroxine 50 MCG tablet   Commonly known as: SYNTHROID, LEVOTHROID   Take 50 mcg by mouth daily.      loratadine 10 MG tablet   Commonly known as: CLARITIN   Take 10 mg by mouth daily.      LORazepam 0.5 MG tablet   Commonly known as: ATIVAN   Take 0.5 mg by mouth at bedtime.      memantine 10 MG tablet   Commonly known as: NAMENDA   Take 10 mg by mouth daily.      senna-docusate 8.6-50 MG per tablet   Commonly known as: Senokot-S   Take 1 tablet by mouth at bedtime as needed.      sertraline 50 MG tablet   Commonly known as: ZOLOFT   Take 50 mg by mouth daily.      Tamsulosin HCl 0.4 MG Caps   Commonly known as: FLOMAX   Take 0.4 mg by mouth daily.      Vitamin D 2000 UNITS tablet   Take 2,000 Units by mouth daily.           Follow-up Information    Please follow up. (SNF MD in 1-2days)           The results of significant diagnostics from this hospitalization (including imaging, microbiology, ancillary and laboratory) are listed below for reference.    Significant Diagnostic Studies: Dg Chest 2 View  11/10/2011  *RADIOLOGY REPORT*  Clinical Data: Coughing and weakness.  CHEST - 2 VIEW  Comparison: 09/03/2011  Findings: The lungs are clear without focal infiltrate, edema, pneumothorax or pleural effusion. Cardiopericardial silhouette is at upper limits of normal for size. Imaged bony structures of the thorax are intact.  IMPRESSION: Stable.  No acute findings.   Original Report Authenticated By: ERIC A. MANSELL, M.D.    Dg Pelvis 1-2 Views  11/09/2011  *RADIOLOGY REPORT*  Clinical Data: Fall.  Bilateral hip  pain.  PELVIS - 1-2 VIEW  Comparison: None.  Findings: Bilateral total hip arthroplasties are in place.  Both devices are located and there is no fracture.  Asymmetric positioning of the femoral heads in the acetabular cup is consistent with wear of the linings of the cups.  IMPRESSION:  1.  No acute finding. 2.  Findings compatible with wear of the lining of the patient's acetabular cups.  Original Report Authenticated By: Bernadene Bell. Maricela Curet, M.D.    Dg Shoulder Right  11/09/2011  *RADIOLOGY REPORT*  Clinical Data: Fall, pain.  RIGHT SHOULDER - 2+ VIEW  Comparison: Plain films 09/04/2011.  Findings: There is no fracture or dislocation.  Postoperative change noted.  Glenohumeral degenerative disease is present. Imaged right lung and ribs are unremarkable.  IMPRESSION: No acute finding.   Original Report Authenticated By: Bernadene Bell. D'ALESSIO, M.D.    Dg Elbow Complete Left  10/30/2011  *RADIOLOGY REPORT*  Clinical Data: Recent fall  LEFT ELBOW - COMPLETE 3+ VIEW  Comparison: None  Findings: On the lateral view there is a small elbow joint effusion present somewhat displacing the anterior fat pad.  No definite radial fracture is seen.  There is however slight cortical irregularity of the coronoid process of the proximal ulna and a nondisplaced fracture is a consideration.  IMPRESSION: Small elbow effusion.  Suspect fracture possibly of the coronoid process of the proximal left ulna.  Clinically significant discrepancy from primary report, if provided: None   Original Report Authenticated By: Juline Patch, M.D.    Dg Forearm Left  10/30/2011  *RADIOLOGY REPORT*  Clinical Data: Larey Seat  LEFT FOREARM - 2 VIEW  Comparison: 02/15/2011  Findings: Fracture of the coronoid process of the ulna, distracted 1 mm and displaced distally 2- 3 mm.  Distal forearm unremarkable.  IMPRESSION:  1.  Fracture of the coronoid process of the ulna as above.  Clinically significant discrepancy from primary report, if provided: None    Original Report Authenticated By: Osa Craver, M.D.    Dg Wrist Complete Left  10/30/2011  *RADIOLOGY REPORT*  Clinical Data: Larey Seat  LEFT WRIST - COMPLETE 3+ VIEW  Comparison: None.  Findings: Carpal rows intact. Negative for fracture, dislocation, or other acute abnormality.  Normal alignment and mineralization. No significant degenerative change.  Regional soft tissues unremarkable.  IMPRESSION:  Negative  Clinically significant discrepancy from primary report, if provided: None   Original Report Authenticated By: Thora Lance III, M.D.    Dg Hip Complete Left  10/26/2011  *RADIOLOGY REPORT*  Clinical Data: Recent fall, left hip pain  LEFT HIP - COMPLETE 2+ VIEW  Comparison: Pelvis left hip films of 02/15/2011  Findings: Bilateral total hip replacements are present.  The femoral and acetabular components appear unchanged in position on the frontal view.  The bones are diffusely osteopenic.  No acute fracture is seen.  There are degenerative changes noted in the lower lumbar spine.  IMPRESSION: Stable appearance of bilateral total hip replacements.  Diffuse osteopenia with degenerative change in the lower lumbar spine.  Original Report Authenticated By: Juline Patch, M.D.   Dg Femur Right  11/09/2011  *RADIOLOGY REPORT*  Clinical Data: Fall.  Pain.  RIGHT FEMUR - 2 VIEW  Comparison: Plain films right hip 09/03/2011.  Findings: Right total hip replacement is in place.  The device is located.  Periprosthetic fracture along the lateral margin of the proximal femur shows progressive healing.  No new fracture is identified.  There is a small knee joint effusion with degenerative change about the knee.  IMPRESSION:  1.  No acute finding. 2.  Healing incomplete periprosthetic fracture proximal right femur.   Original Report Authenticated By: Bernadene Bell. Maricela Curet, M.D.    Ct Head Wo Contrast  11/09/2011  *RADIOLOGY REPORT*  Clinical Data:  Status post fall.  Pain is.  Right.  No free pain.  CT  HEAD WITHOUT CONTRAST CT CERVICAL  SPINE WITHOUT CONTRAST  Technique:  Multidetector CT imaging of the head and cervical spine was performed following the standard protocol without intravenous contrast.  Multiplanar CT image reconstructions of the cervical spine were also generated.  Comparison:  Head and cervical spine CT scan 10/26/2011.  CT HEAD  Findings: Marked cortical atrophy and extensive chronic microvascular ischemic change are again seen.  There is a scalp contusion over the left parietal bone, new since the prior study. No evidence of acute intracranial abnormality including infarction, hemorrhage, mass lesion, mass effect, midline shift or abnormal extra-axial fluid collection.  No hydrocephalus or pneumocephalus. Calvarium intact.  IMPRESSION:  1.  Scalp contusion over the left parietal bone.  No underlying fracture or acute intracranial abnormality. 2.  Marked atrophy and chronic microvascular ischemic change.  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  There is no fracture or subluxation of the cervical spine. Marked multilevel degenerative disease is unchanged in appearance. Lung apices are clear.  IMPRESSION: No acute finding.  Marked multilevel degenerative disease.   Original Report Authenticated By: Bernadene Bell. Maricela Curet, M.D.    Ct Head Wo Contrast  10/26/2011  *RADIOLOGY REPORT*  Clinical Data:  Larey Seat and hit head.  Neck pain.  Head pain.  CT HEAD WITHOUT CONTRAST CT CERVICAL SPINE WITHOUT CONTRAST  Technique:  Multidetector CT imaging of the head and cervical spine was performed following the standard protocol without intravenous contrast.  Multiplanar CT image reconstructions of the cervical spine were also generated.  Comparison:  MRI cervical spine 04/11/2011.  CT head cervical spine 03/16/2011.  CT HEAD  Findings: There is no evidence for acute infarction, intracranial hemorrhage, mass lesion, hydrocephalus, or extra-axial fluid. Moderate  to severe atrophy is present.  Advanced chronic microvascular ischemic change is present in the periventricular and subcortical white matter.  The calvarium is intact.  There is moderate vascular calcification. There is no acute sinus or mastoid fluid.  There is a similar appearance to priors.  IMPRESSION: Atrophy and chronic microvascular ischemic change.  No skull fracture or intracranial hemorrhage.  CT CERVICAL SPINE  Findings: No visible cervical spine fracture or traumatic subluxation.  No prevertebral soft tissue swelling or visible intraspinal hematoma.  Trachea midline.  Lung apices clear.  No upper rib fracture.  Advanced disc space narrowing C3-C7. Bilateral carotid calcification.  Multilevel facet arthropathy.  IMPRESSION: No visible cervical spine fracture or traumatic subluxation. Similar appearance to priors.  Original Report Authenticated By: Elsie Stain, M.D.   Ct Cervical Spine Wo Contrast  11/09/2011  *RADIOLOGY REPORT*  Clinical Data:  Status post fall.  Pain is.  Right.  No free pain.  CT HEAD WITHOUT CONTRAST CT CERVICAL SPINE WITHOUT CONTRAST  Technique:  Multidetector CT imaging of the head and cervical spine was performed following the standard protocol without intravenous contrast.  Multiplanar CT image reconstructions of the cervical spine were also generated.  Comparison:  Head and cervical spine CT scan 10/26/2011.  CT HEAD  Findings: Marked cortical atrophy and extensive chronic microvascular ischemic change are again seen.  There is a scalp contusion over the left parietal bone, new since the prior study. No evidence of acute intracranial abnormality including infarction, hemorrhage, mass lesion, mass effect, midline shift or abnormal extra-axial fluid collection.  No hydrocephalus or pneumocephalus. Calvarium intact.  IMPRESSION:  1.  Scalp contusion over the left parietal bone.  No underlying fracture or acute intracranial abnormality. 2.  Marked atrophy and chronic  microvascular ischemic change.  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  There is no fracture or subluxation of the cervical spine. Marked multilevel degenerative disease is unchanged in appearance. Lung apices are clear.  IMPRESSION: No acute finding.  Marked multilevel degenerative disease.   Original Report Authenticated By: Bernadene Bell. Maricela Curet, M.D.    Ct Cervical Spine Wo Contrast  10/26/2011  *RADIOLOGY REPORT*  Clinical Data:  Larey Seat and hit head.  Neck pain.  Head pain.  CT HEAD WITHOUT CONTRAST CT CERVICAL SPINE WITHOUT CONTRAST  Technique:  Multidetector CT imaging of the head and cervical spine was performed following the standard protocol without intravenous contrast.  Multiplanar CT image reconstructions of the cervical spine were also generated.  Comparison:  MRI cervical spine 04/11/2011.  CT head cervical spine 03/16/2011.  CT HEAD  Findings: There is no evidence for acute infarction, intracranial hemorrhage, mass lesion, hydrocephalus, or extra-axial fluid. Moderate to severe atrophy is present.  Advanced chronic microvascular ischemic change is present in the periventricular and subcortical white matter.  The calvarium is intact.  There is moderate vascular calcification. There is no acute sinus or mastoid fluid.  There is a similar appearance to priors.  IMPRESSION: Atrophy and chronic microvascular ischemic change.  No skull fracture or intracranial hemorrhage.  CT CERVICAL SPINE  Findings: No visible cervical spine fracture or traumatic subluxation.  No prevertebral soft tissue swelling or visible intraspinal hematoma.  Trachea midline.  Lung apices clear.  No upper rib fracture.  Advanced disc space narrowing C3-C7. Bilateral carotid calcification.  Multilevel facet arthropathy.  IMPRESSION: No visible cervical spine fracture or traumatic subluxation. Similar appearance to priors.  Original Report Authenticated By: Elsie Stain, M.D.    Dg Shoulder Left  10/30/2011  *RADIOLOGY REPORT*  Clinical Data: Larey Seat  LEFT SHOULDER - 2+ VIEW  Comparison: 03/16/2011  Findings: Degenerative changes at the [facility] joint as before.  There is a somewhat high-riding left humeral head as before.  Negative for fracture or dislocation.  IMPRESSION:  Stable degenerative changes without fracture or dislocation.  Clinically significant discrepancy from primary report, if provided: None   Original Report Authenticated By: Osa Craver, M.D.     Microbiology: No results found for this or any previous visit (from the past 240 hour(s)).   Labs: Basic Metabolic Panel:  Lab 11/14/11 9604 11/11/11 0508 11/10/11 0420 11/09/11 1954  NA 139 139 141 138  K 3.7 3.9 3.3* 4.0  CL 103 103 105 101  CO2 29 28 28 29   GLUCOSE 90 94 101* 189*  BUN 22 21 19 23   CREATININE 0.93 1.08 0.96 1.05  CALCIUM 9.1 9.1 9.1 9.1  MG -- -- 2.0 --  PHOS -- -- -- --   Liver Function Tests: No results found for this basename: AST:5,ALT:5,ALKPHOS:5,BILITOT:5,PROT:5,ALBUMIN:5 in the last 168 hours No results found for this basename: LIPASE:5,AMYLASE:5 in the last 168 hours No results found for this basename: AMMONIA:5 in the last 168 hours CBC:  Lab 11/14/11 0436 11/11/11 0508 11/10/11 0420 11/09/11 1954  WBC 7.3 8.1 11.8* 12.8*  NEUTROABS -- -- -- 11.1*  HGB 10.0* 9.5* 9.7* 9.9*  HCT 30.5* 29.0* 28.5* 30.8*  MCV 95.6 96.0 94.4 96.3  PLT 295 290 332 328   Cardiac Enzymes: No results found for this basename: CKTOTAL:5,CKMB:5,CKMBINDEX:5,TROPONINI:5 in the last 168 hours BNP: BNP (last 3 results) No results found for this basename: PROBNP:3 in the last 8760 hours CBG:  No results found for this basename: GLUCAP:5 in the last 168 hours  Time coordinating discharge: >30 minutes  Signed:  Radha Coggins C  [facility] 11/14/2011, 1:29 PM

## 2011-11-14 NOTE — Progress Notes (Signed)
Patient is set to discharge to [facility] today. Wife & daughter aware. [facility] called for 3:00 pickup.   Clinical Social Work Department CLINICAL SOCIAL WORK PLACEMENT NOTE 11/14/2011  Patient:  ATILLA, ZOLLNER  Account Number:  [id number] Admit date:  11/09/2011  Clinical Social Worker:  Orpah Greek  Date/time:  11/10/2011 03:04 PM  Clinical Social Work is seeking post-discharge placement for this patient at the following level of care:   SKILLED NURSING   (*CSW will update this form in Epic as items are completed)   11/10/2011  Patient/family provided with [facility] Department of Clinical Social Work's list of facilities offering this level of care within the geographic area requested by the patient (or if unable, by the patient's family).  11/10/2011  Patient/family informed of their freedom to choose among providers that offer the needed level of care, that participate in Medicare, Medicaid or managed care program needed by the patient, have an available bed and are willing to accept the patient.  11/10/2011  Patient/family informed of [facility]' ownership interest in [facility], as well as of the fact that they are under no obligation to receive care at this facility.  PASARR submitted to EDS on 11/10/2011 PASARR number received from EDS on 11/10/2011  FL2 transmitted to all facilities in geographic area requested by pt/family on  11/10/2011 FL2 transmitted to all facilities within larger geographic area on   Patient informed that his/her managed care company has contracts with or will negotiate with  certain facilities, including the following:     Patient/family informed of bed offers received:  11/13/2011 Patient chooses bed at [facility] Physician recommends and patient chooses bed at    Patient to be transferred to [facility] on  11/14/2011 Patient to be transferred to facility by  [facility]  The following physician request were entered in Epic:   Additional Comments:    Unice Bailey, LCSW [facility] Clinical Social Worker cell #: [phone]

## 2011-11-14 NOTE — Progress Notes (Signed)
Physical Therapy Treatment/Late entry for 11/13/11 treatment Patient Details Name: Robert Preston MRN: [id number] DOB: 12/24/1922 Today's Date: 11/14/2011 Time: 1415-1435 on 11/13/11 PT Time Calculation (min): 20 min  PT Assessment / Plan / Recommendation Comments on Treatment Session  Pt. appears to require more asssitance w/ ambulation, decreased initiation of stepping and festinating gait pattern. Pt.ll benefit from SNF.    Follow Up Recommendations  Skilled nursing facility    Barriers to Discharge        Equipment Recommendations  None recommended by PT    Recommendations for Other Services OT consult  Frequency Min 3X/week   Plan Discharge plan remains appropriate;Frequency remains appropriate    Precautions / Restrictions Precautions Precautions: Fall   Pertinent Vitals/Pain     Mobility  Transfers Transfers: Sit to Stand;Stand to Sit Sit to Stand: 1: +2 Total assist;With upper extremity assist Sit to Stand: Patient Percentage: 40% Stand to Sit: 4: Min assist;To chair/3-in-1;With upper extremity assist Details for Transfer Assistance: Pt required lifting asssitance from recliner. Once standing could not take any steps. sat pt back down. next trial pt was able to stand w/ mod assist and was able to initiate stepping w/ tactile cues. Ambulation/Gait Ambulation/Gait Assistance: 1: +2 Total assist Ambulation/Gait: Patient Percentage: 60% Assistive device: Rolling walker Ambulation/Gait Assistance Details:  (constant tactile cues for weight shifting and for forward pr) Gait Pattern: Festinating Gait velocity: decreased    Exercises     PT Diagnosis:    PT Problem List:   PT Treatment Interventions:     PT Goals Acute Rehab PT Goals Pt will go Sit to Stand: with supervision PT Goal: Sit to Stand - Progress: Progressing toward goal Pt will Ambulate: 51 - 150 feet;with supervision;with least restrictive assistive device PT Goal: Ambulate - Progress: Progressing toward  goal  Visit Information  Last PT Received On: 11/13/11 Assistance Needed: +2    Subjective Data  Subjective: I am afraid i will hit the wall.   Cognition  Overall Cognitive Status: History of cognitive impairments - at baseline Arousal/Alertness: Awake/alert Orientation Level: Place;Time;Situation Behavior During Session: [name] for tasks performed    Balance     End of Session PT - End of Session Equipment Utilized During Treatment: Gait belt Activity Tolerance: Patient limited by fatigue Patient left: in chair;with call bell/phone within reach;with chair alarm set Nurse Communication: Mobility status   GP     Rada Hay 11/14/2011, 12:51 PM

## 2011-12-19 ENCOUNTER — Emergency Department (HOSPITAL_COMMUNITY): Payer: Medicare Other

## 2011-12-19 ENCOUNTER — Encounter (HOSPITAL_COMMUNITY): Payer: Self-pay | Admitting: *Deleted

## 2011-12-19 ENCOUNTER — Emergency Department (HOSPITAL_COMMUNITY)
Admission: EM | Admit: 2011-12-19 | Discharge: 2011-12-19 | Disposition: A | Discharge status: Skilled Nursing Facility | Payer: Medicare Other | Attending: Emergency Medicine | Admitting: Emergency Medicine

## 2011-12-19 DIAGNOSIS — Z79899 Other long term (current) drug therapy: Secondary | ICD-10-CM | POA: Insufficient documentation

## 2011-12-19 DIAGNOSIS — I498 Other specified cardiac arrhythmias: Secondary | ICD-10-CM | POA: Insufficient documentation

## 2011-12-19 DIAGNOSIS — Z9089 Acquired absence of other organs: Secondary | ICD-10-CM | POA: Insufficient documentation

## 2011-12-19 DIAGNOSIS — I10 Essential (primary) hypertension: Secondary | ICD-10-CM | POA: Insufficient documentation

## 2011-12-19 DIAGNOSIS — R4182 Altered mental status, unspecified: Secondary | ICD-10-CM | POA: Insufficient documentation

## 2011-12-19 DIAGNOSIS — F0390 Unspecified dementia without behavioral disturbance: Secondary | ICD-10-CM

## 2011-12-19 DIAGNOSIS — Z7982 Long term (current) use of aspirin: Secondary | ICD-10-CM | POA: Insufficient documentation

## 2011-12-19 LAB — POCT I-STAT, CHEM 8
BUN: 24 mg/dL — ABNORMAL HIGH (ref 6–23)
Creatinine, Ser: 1.2 mg/dL (ref 0.50–1.35)
Potassium: 3.9 mEq/L (ref 3.5–5.1)
Sodium: 146 mEq/L — ABNORMAL HIGH (ref 135–145)
TCO2: 26 mmol/L (ref 0–100)

## 2011-12-19 MED ORDER — ASPIRIN 81 MG PO CHEW
81.0000 mg | CHEWABLE_TABLET | Freq: Once | ORAL | Status: AC
  Administered 2011-12-19: 81 mg via ORAL
  Filled 2011-12-19: qty 1

## 2011-12-19 NOTE — ED Notes (Signed)
[facility] contacted for transport 

## 2011-12-19 NOTE — ED Notes (Signed)
Dr. Campos at bedside   

## 2011-12-19 NOTE — ED Provider Notes (Signed)
History     CSN: [id number]  Arrival date & time 12/19/11  1256   First MD Initiated Contact with Patient 12/19/11 1256      Chief Complaint  Patient presents with  . Altered Mental Status  . Bradycardia    (Consider location/radiation/quality/duration/timing/severity/associated sxs/prior treatment) HPI Comments: Pt transferred from nursing home for being unresponsive. EMS states that he was only intermittently responsive to pain. Pt also noted to be bradycardic with hypertension.   Patient is a 78 y.o. male presenting with altered mental status. The history is provided by the EMS personnel.  Altered Mental Status This is a new problem. The current episode started today. The problem occurs constantly. The problem has been gradually improving. Pertinent negatives include no abdominal pain, change in bowel habit, chest pain, fever, headaches, nausea or vomiting. Nothing aggravates the symptoms. He has tried nothing for the symptoms.    Past Medical History  Diagnosis Date  . Hypertension   . Hypercholesterolemia   . DEMENTIA   . Arthritis   . Hypothyroidism   . Neuromuscular disorder     carpal tunnel    Past Surgical History  Procedure Date  . Joint replacement   . Eye surgery     lasik both eyes  . Transurethral resection of prostate [id number]  . Shoulder arthroscopy 2005    right  . Shoulder arthroscopy 2002    rt  . Total hip arthroplasty     right and left  . Cholecystectomy   . Colonoscopy   . Carpal tunnel release 05/10/2011    Procedure: CARPAL TUNNEL RELEASE;  Surgeon: Nicki Reaper, MD;  Location:  SUR[facility]ERY CENTER;  Service: Orthopedics;  Laterality: Right;    No family history on file.  History  Substance Use Topics  . Smoking status: Never Smoker   . Smokeless tobacco: Never Used  . Alcohol Use: No     quit 2 years ago      Review of Systems  Unable to perform ROS: Dementia  Constitutional: Negative for fever.  Cardiovascular:  Negative for chest pain.  Gastrointestinal: Negative for nausea, vomiting, abdominal pain, diarrhea and change in bowel habit.  Neurological: Negative for headaches.  Psychiatric/Behavioral: Positive for altered mental status.    Allergies  Morphine and related and Penicillins  Home Medications   Current Outpatient Rx  Name Route Sig Dispense Refill  . ACETAMINOPHEN 325 MG PO TABS Oral Take 2 tablets (650 mg total) by mouth every 6 (six) hours as needed for pain or fever (or Fever >/= 101).    . ASPIRIN EC 81 MG PO TBEC Oral Take 81 mg by mouth daily.    Marland Kitchen VITAMIN D 2000 UNITS PO TABS Oral Take 2,000 Units by mouth daily.    . DONEPEZIL HCL 10 MG PO TABS Oral Take 10 mg by mouth at bedtime.     Marland Kitchen FINASTERIDE 5 MG PO TABS Oral Take 5 mg by mouth daily.      Marland Kitchen LEVOTHYROXINE SODIUM 50 MCG PO TABS Oral Take 50 mcg by mouth daily.    Marland Kitchen LORATADINE 10 MG PO TABS Oral Take 10 mg by mouth daily.      Marland Kitchen LORAZEPAM 0.5 MG PO TABS Oral Take 0.5 mg by mouth at bedtime.    Marland Kitchen MEMANTINE HCL 10 MG PO TABS Oral Take 10 mg by mouth daily.    Bernadette Hoit SODIUM 8.6-50 MG PO TABS Oral Take 1 tablet by mouth at bedtime as needed.    Marland Kitchen  SERTRALINE HCL 50 MG PO TABS Oral Take 50 mg by mouth daily.    Marland Kitchen TAMSULOSIN HCL 0.4 MG PO CAPS Oral Take 0.4 mg by mouth daily.      BP 177/78  Pulse 64  Resp 16  SpO2 100%  Physical Exam  Nursing note and vitals reviewed. Constitutional: He appears well-developed and well-nourished. No distress.  HENT:  Head: Normocephalic and atraumatic.  Eyes: Pupils are equal, round, and reactive to light.  Cardiovascular: Normal rate and normal heart sounds.   Pulmonary/Chest: Effort normal and breath sounds normal. No respiratory distress.  Abdominal: Soft. He exhibits no distension. There is no tenderness.  Musculoskeletal: Normal range of motion.  Neurological: No cranial nerve deficit or sensory deficit. He exhibits normal muscle tone. Coordination normal. GCS eye  subscore is 3. GCS verbal subscore is 1. GCS motor subscore is 6.       Pt refuses to talk but gives head shakes to answer questions.  Skin: Skin is warm and dry.  Psychiatric: He has a normal mood and affect.    ED Course  Procedures (including critical care time)  Labs Reviewed  POCT I-STAT, CHEM 8 - Abnormal; Notable for the following:    Sodium 146 (*)     BUN 24 (*)     Hemoglobin 12.6 (*)     HCT 37.0 (*)     All other components within normal limits   Ct Head Wo Contrast  12/19/2011  *RADIOLOGY REPORT*  Clinical Data: Unresponsive.  CT HEAD WITHOUT CONTRAST  Technique:  Contiguous axial images were obtained from the base of the skull through the vertex without contrast.  Comparison: 11/09/2011.  Findings: No skull fracture or intracranial hemorrhage.  Global atrophy.  Ventricular prominence may be related to atrophy although difficult to completely exclude mild component of hydrocephalus.  Appearance is unchanged.  Moderate small vessel disease type changes without CT evidence of large acute infarct.  No intracranial mass lesion detected on this unenhanced exam.  Vascular calcifications.  IMPRESSION: Prominent small vessel disease type changes without CT evidence of large acute infarct.  No intracranial hemorrhage.  Global atrophy with ventricular prominence unchanged.  Vascular calcifications.   Original Report Authenticated By: Fuller Canada, M.D.     Date: 12/19/2011  Rate: 59  Rhythm: normal sinus rhythm  QRS Axis: left  Intervals: normal  ST/T Wave abnormalities: normal  Conduction Disutrbances:none  Narrative Interpretation: NSR with PVCs  Old EKG Reviewed: unchanged    1. Altered mental status   2. Dementia       MDM  1:11 PM Pt seen and examined. Pt sent from new nursing home (has only been there a week) for being unresponsive. On exam, patient reacts to pain and will hold eyes open if opened by staff. Pt will subtly shake head yes/no in response to questions.  Due to nursing home concerns, will get Istat Chem 8 and head CT. Of note, patient mildly bradycardic, but will good BP. Do not feel this is a cardiac concern.  3:49 PM Pt has returned to baseline. Will send back to nursing home.        Daleen Bo, MD 12/19/11 1549  Daleen Bo, MD 12/19/11 [phone]

## 2011-12-19 NOTE — ED Notes (Signed)
First contact with pt. Family states pt is more alert then at admission. Family states pt is close to baseline. Pt confused but responds to questions. MAE randomly. Monitors intact.

## 2011-12-19 NOTE — ED Notes (Signed)
Pt ambulate with 2 person assist. Shuffling gait noted.

## 2011-12-19 NOTE — ED Notes (Addendum)
Patient ambulates with assistance. Two person assisted.  Robert Preston and Robert Preston.

## 2011-12-19 NOTE — ED Notes (Signed)
Pt is from NH and states that at 1130 got him out of bed.  Found patient in recliner unresponsive at 1145.  Per ems pt had some pupils constricted and EMS gave Narcan 2mg  and he opened eyes and then went back unresponsive.  BP within normal and EMS reported HR in the 30s.  No cardiac history.  Pt has history of dementia

## 2011-12-19 NOTE — ED Provider Notes (Signed)
I saw and evaluated the patient, reviewed the resident's note and I agree with the findings and plan. I personally evaluated the ECG and agree with the interpretation of the resident  The patient is well-appearing.  He had rapid improvement in his mental status.  This may represent a TIA.  He is on 81 mg of aspirin at this time.  He has known dementia and therefore it is reasonable to be discharged home and instructed to followup with his neurologist and his primary care physician.  The family is agreeable to outpatient plan.  He has a normal neurologic exam at this time.  CT head is normal.  Labs are normal   1. Altered mental status   2. Dementia    Results for orders placed during the hospital encounter of 12/19/11  POCT I-STAT, CHEM 8      Component Value Range   Sodium 146 (*) 135 - 145 mEq/L   Potassium 3.9  3.5 - 5.1 mEq/L   Chloride 106  96 - 112 mEq/L   BUN 24 (*) 6 - 23 mg/dL   Creatinine, Ser 1.61  0.50 - 1.35 mg/dL   Glucose, Bld 87  70 - 99 mg/dL   Calcium, Ion 0.96  0.45 - 1.30 mmol/L   TCO2 26  0 - 100 mmol/L   Hemoglobin 12.6 (*) 13.0 - 17.0 g/dL   HCT 40.9 (*) 81.1 - 91.4 %   Ct Head Wo Contrast  12/19/2011  *RADIOLOGY REPORT*  Clinical Data: Unresponsive.  CT HEAD WITHOUT CONTRAST  Technique:  Contiguous axial images were obtained from the base of the skull through the vertex without contrast.  Comparison: 11/09/2011.  Findings: No skull fracture or intracranial hemorrhage.  Global atrophy.  Ventricular prominence may be related to atrophy although difficult to completely exclude mild component of hydrocephalus.  Appearance is unchanged.  Moderate small vessel disease type changes without CT evidence of large acute infarct.  No intracranial mass lesion detected on this unenhanced exam.  Vascular calcifications.  IMPRESSION: Prominent small vessel disease type changes without CT evidence of large acute infarct.  No intracranial hemorrhage.  Global atrophy with ventricular  prominence unchanged.  Vascular calcifications.   Original Report Authenticated By: Fuller Canada, M.D.    I personally reviewed the imaging tests through PACS system  I reviewed available ER/hospitalization records thought the EMR   Lyanne Co, MD 12/19/11 1623

## 2011-12-29 ENCOUNTER — Emergency Department (HOSPITAL_COMMUNITY): Payer: Medicare Other

## 2011-12-29 ENCOUNTER — Emergency Department (HOSPITAL_COMMUNITY)
Admission: EM | Admit: 2011-12-29 | Discharge: 2011-12-29 | Disposition: A | Discharge status: Home / Self Care | Payer: Medicare Other | Attending: Emergency Medicine | Admitting: Emergency Medicine

## 2011-12-29 DIAGNOSIS — W19XXXA Unspecified fall, initial encounter: Secondary | ICD-10-CM | POA: Insufficient documentation

## 2011-12-29 DIAGNOSIS — Y9210 Unspecified residential institution as the place of occurrence of the external cause: Secondary | ICD-10-CM | POA: Insufficient documentation

## 2011-12-29 DIAGNOSIS — T1490XA Injury, unspecified, initial encounter: Secondary | ICD-10-CM | POA: Insufficient documentation

## 2011-12-29 DIAGNOSIS — Z96649 Presence of unspecified artificial hip joint: Secondary | ICD-10-CM | POA: Insufficient documentation

## 2011-12-29 DIAGNOSIS — Z79899 Other long term (current) drug therapy: Secondary | ICD-10-CM | POA: Insufficient documentation

## 2011-12-29 DIAGNOSIS — I10 Essential (primary) hypertension: Secondary | ICD-10-CM | POA: Insufficient documentation

## 2011-12-29 DIAGNOSIS — M899 Disorder of bone, unspecified: Secondary | ICD-10-CM | POA: Insufficient documentation

## 2011-12-29 DIAGNOSIS — M5030 Other cervical disc degeneration, unspecified cervical region: Secondary | ICD-10-CM | POA: Insufficient documentation

## 2011-12-29 DIAGNOSIS — F0390 Unspecified dementia without behavioral disturbance: Secondary | ICD-10-CM | POA: Insufficient documentation

## 2011-12-29 DIAGNOSIS — I517 Cardiomegaly: Secondary | ICD-10-CM | POA: Insufficient documentation

## 2011-12-29 LAB — POCT I-STAT, CHEM 8
Chloride: 102 mEq/L (ref 96–112)
Creatinine, Ser: 1.1 mg/dL (ref 0.50–1.35)
Glucose, Bld: 76 mg/dL (ref 70–99)
Potassium: 3.7 mEq/L (ref 3.5–5.1)

## 2011-12-29 NOTE — ED Notes (Signed)
Pt to department via EMS from [facility]- pt here with unwitnessed fall. Staff reports pt was found in the floor beside the bed. Unknown LOC, denies any pain. In c-collar and LSB. Bp-140/54 Hr-56. No distress noted.

## 2011-12-29 NOTE — ED Notes (Signed)
Patient transported to CT 

## 2011-12-29 NOTE — ED Notes (Signed)
Pt ambulated without difficultly and tolerated PO fluids well.

## 2011-12-29 NOTE — ED Provider Notes (Signed)
History     CSN: [id number]  Arrival date & time 12/29/11  4098   First MD Initiated Contact with Patient 12/29/11 [phone]      Chief Complaint  Patient presents with  . Fall    (Consider location/radiation/quality/duration/timing/severity/associated sxs/prior treatment) HPI Comments: [facility]. He was on the floor beside his bed if your caregiver stepped out. Patient does not recall falling. Unknown loss of consciousness. He is awake alert and oriented x2. He follows commands. He denies any pain. No headache, chest pain, shortness of breath, nausea or vomiting. Seen in ED last week after presumed TIA episode.  The history is provided by the patient and the EMS personnel. The history is limited by the condition of the patient.    Past Medical History  Diagnosis Date  . Hypertension   . Hypercholesterolemia   . DEMENTIA   . Arthritis   . Hypothyroidism   . Neuromuscular disorder     carpal tunnel    Past Surgical History  Procedure Date  . Joint replacement   . Eye surgery     lasik both eyes  . Transurethral resection of prostate [id number]  . Shoulder arthroscopy 2005    right  . Shoulder arthroscopy 2002    rt  . Total hip arthroplasty     right and left  . Cholecystectomy   . Colonoscopy   . Carpal tunnel release 05/10/2011    Procedure: CARPAL TUNNEL RELEASE;  Surgeon: Nicki Reaper, MD;  Location: [facility];  Service: Orthopedics;  Laterality: Right;    No family history on file.  History  Substance Use Topics  . Smoking status: Never Smoker   . Smokeless tobacco: Never Used  . Alcohol Use: No     quit 2 years ago      Review of Systems  Unable to perform ROS: Dementia    Allergies  Morphine and related and Penicillins  Home Medications   Current Outpatient Rx  Name Route Sig Dispense Refill  . ACETAMINOPHEN 325 MG PO TABS Oral Take 2 tablets (650 mg total) by mouth every 6  (six) hours as needed for pain or fever (or Fever >/= 101).    . ASPIRIN EC 81 MG PO TBEC Oral Take 81 mg by mouth daily.    Marland Kitchen VITAMIN D 2000 UNITS PO TABS Oral Take 2,000 Units by mouth daily.    Marland Kitchen DIVALPROEX SODIUM 250 MG PO TBEC Oral Take 250 mg by mouth 2 (two) times daily.    . DONEPEZIL HCL 10 MG PO TABS Oral Take 10 mg by mouth at bedtime.     Marland Kitchen FINASTERIDE 5 MG PO TABS Oral Take 5 mg by mouth daily.      Marland Kitchen HYDROCODONE-ACETAMINOPHEN 5-325 MG PO TABS Oral Take 1-2 tablets by mouth every 4 (four) hours as needed. For pain    . LEVOTHYROXINE SODIUM 50 MCG PO TABS Oral Take 50 mcg by mouth daily.    Marland Kitchen LORATADINE 10 MG PO TABS Oral Take 10 mg by mouth daily.     Marland Kitchen LORAZEPAM 0.5 MG PO TABS Oral Take 0.5 mg by mouth at bedtime.    Marland Kitchen MEMANTINE HCL 10 MG PO TABS Oral Take 10 mg by mouth daily.    . SENNA-DOCUSATE SODIUM 8.6-50 MG PO TABS Oral Take 1 tablet by mouth as needed. For constipation    . SERTRALINE HCL 50 MG PO TABS Oral Take 50 mg by  mouth at bedtime.     . TAMSULOSIN HCL 0.4 MG PO CAPS Oral Take 0.4 mg by mouth daily after supper.       BP 165/72  Pulse 71  Temp 98.5 F (36.9 C) (Oral)  Resp 18  SpO2 100%  Physical Exam  Constitutional: He is oriented to person, place, and time. He appears well-developed and well-nourished. No distress.  HENT:  Head: Normocephalic and atraumatic.  Mouth/Throat: Oropharynx is clear and moist. No oropharyngeal exudate.  Eyes: Conjunctivae normal and EOM are normal. Pupils are equal, round, and reactive to light.  Neck: Normal range of motion. Neck supple.       No C-spine tenderness  Cardiovascular: Normal rate, regular rhythm and normal heart sounds.   No murmur heard. Pulmonary/Chest: Effort normal and breath sounds normal. No respiratory distress.  Abdominal: Soft. There is no tenderness. There is no rebound.  Musculoskeletal: Normal range of motion. He exhibits no edema and no tenderness.       Pelvis stable  Neurological: He is alert  and oriented to person, place, and time. No cranial nerve deficit.       CN 2-12 intact, 5/5 strength throughout.  Skin: Skin is warm.    ED Course  Procedures (including critical care time)  Labs Reviewed  POCT I-STAT, CHEM 8 - Abnormal; Notable for the following:    Hemoglobin 11.2 (*)     HCT 33.0 (*)     All other components within normal limits  POCT I-STAT TROPONIN I   Dg Chest 1 View  12/29/2011  *RADIOLOGY REPORT*  Clinical Data: Fall.  CHEST - 1 VIEW  Comparison: 11/10/2011  Findings: Mild cardiomegaly.  Mild chronic peribronchial thickening.  No confluent opacities or effusions.  No acute bony abnormality. No visible rib fracture.  No pneumothorax.  IMPRESSION: Mild cardiomegaly and chronic bronchitic changes.   Original Report Authenticated By: Cyndie Chime, M.D.    Dg Pelvis 1-2 Views  12/29/2011  *RADIOLOGY REPORT*  Clinical Data: Fall.  PELVIS - 1-2 VIEW  Comparison: 11/09/2011  Findings: Bilateral hip replacements, unchanged.  No acute bony abnormality.  No fracture, subluxation or dislocation.  No hardware complicating feature.  Diffuse osteopenia.  Probable fusion across the SI joints.  IMPRESSION: No acute bony abnormality.   Original Report Authenticated By: Cyndie Chime, M.D.    Ct Head Wo Contrast  12/29/2011  *RADIOLOGY REPORT*  Clinical Data: Fall  CT HEAD WITHOUT CONTRAST,CT CERVICAL SPINE WITHOUT CONTRAST  Technique:  Contiguous axial images were obtained from the base of the skull through the vertex without contrast.,Technique: Multidetector CT imaging of the cervical spine was performed. Multiplanar CT image reconstructions were also generated.  Comparison: 12/19/2011 head CT, 11/09/2011 head and cervical spine CT  Findings:  Head:  Prominence of the sulci, cisterns, and ventricles, in keeping with volume loss. There are subcortical and periventricular white matter hypodensities, a nonspecific finding most often seen with chronic microangiopathic changes.   There is no evidence for acute hemorrhage, overt hydrocephalus, mass lesion, or abnormal extra-axial fluid collection.  No definite CT evidence for acute cortical based (large artery) infarction. The visualized paranasal sinuses and mastoid air cells are predominately clear. No displaced calvarial fracture.  IMPRESSION: Volume loss and white matter changes as above.  No CT evidence of acute intracranial abnormality.  Cervical spine:  Maintained craniocervical articulation.  No dens fracture.  Multilevel degenerative changes.  Maintained vertebral body height alignment.  Paravertebral soft tissues within normal limits.  IMPRESSION:  Multilevel degenerative changes are similar to prior. No acute fracture or dislocation.   Original Report Authenticated By: Waneta Martins, M.D.    Ct Cervical Spine Wo Contrast  12/29/2011  *RADIOLOGY REPORT*  Clinical Data: Fall  CT HEAD WITHOUT CONTRAST,CT CERVICAL SPINE WITHOUT CONTRAST  Technique:  Contiguous axial images were obtained from the base of the skull through the vertex without contrast.,Technique: Multidetector CT imaging of the cervical spine was performed. Multiplanar CT image reconstructions were also generated.  Comparison: 12/19/2011 head CT, 11/09/2011 head and cervical spine CT  Findings:  Head:  Prominence of the sulci, cisterns, and ventricles, in keeping with volume loss. There are subcortical and periventricular white matter hypodensities, a nonspecific finding most often seen with chronic microangiopathic changes.  There is no evidence for acute hemorrhage, overt hydrocephalus, mass lesion, or abnormal extra-axial fluid collection.  No definite CT evidence for acute cortical based (large artery) infarction. The visualized paranasal sinuses and mastoid air cells are predominately clear. No displaced calvarial fracture.  IMPRESSION: Volume loss and white matter changes as above.  No CT evidence of acute intracranial abnormality.  Cervical spine:   Maintained craniocervical articulation.  No dens fracture.  Multilevel degenerative changes.  Maintained vertebral body height alignment.  Paravertebral soft tissues within normal limits.  IMPRESSION:  Multilevel degenerative changes are similar to prior. No acute fracture or dislocation.   Original Report Authenticated By: Waneta Martins, M.D.      1. Fall       MDM  Presumed mechanical fall, unknown LOC. Vitals stable, no distress. Pelvis stable, no focal neurological deficits.  Family arrives and confirms the patient is at his baseline. Is no change in his speech or mentation. Hemoglobin stable. Imaging negative for traumatic injury. Patient ambulatory and in ED and tolerating by mouth.    Date: 12/29/2011  Rate: 73  Rhythm: normal sinus rhythm and premature ventricular contractions (PVC)  QRS Axis: left  Intervals: normal  ST/T Wave abnormalities: nonspecific ST/T changes  Conduction Disutrbances:none  Narrative Interpretation:   Old EKG Reviewed: unchanged         Glynn Octave, MD 12/29/11 1252

## 2012-01-13 ENCOUNTER — Encounter (HOSPITAL_COMMUNITY): Payer: Self-pay

## 2012-01-13 ENCOUNTER — Emergency Department (HOSPITAL_COMMUNITY)
Admission: EM | Admit: 2012-01-13 | Discharge: 2012-01-13 | Disposition: A | Discharge status: Skilled Nursing Facility | Payer: Medicare Other | Attending: Emergency Medicine | Admitting: Emergency Medicine

## 2012-01-13 DIAGNOSIS — Z791 Long term (current) use of non-steroidal anti-inflammatories (NSAID): Secondary | ICD-10-CM | POA: Insufficient documentation

## 2012-01-13 DIAGNOSIS — Z8739 Personal history of other diseases of the musculoskeletal system and connective tissue: Secondary | ICD-10-CM | POA: Insufficient documentation

## 2012-01-13 DIAGNOSIS — M25519 Pain in unspecified shoulder: Secondary | ICD-10-CM | POA: Insufficient documentation

## 2012-01-13 DIAGNOSIS — H109 Unspecified conjunctivitis: Secondary | ICD-10-CM | POA: Insufficient documentation

## 2012-01-13 DIAGNOSIS — Z7982 Long term (current) use of aspirin: Secondary | ICD-10-CM | POA: Insufficient documentation

## 2012-01-13 DIAGNOSIS — Y939 Activity, unspecified: Secondary | ICD-10-CM | POA: Insufficient documentation

## 2012-01-13 DIAGNOSIS — F0390 Unspecified dementia without behavioral disturbance: Secondary | ICD-10-CM | POA: Insufficient documentation

## 2012-01-13 DIAGNOSIS — E039 Hypothyroidism, unspecified: Secondary | ICD-10-CM | POA: Insufficient documentation

## 2012-01-13 DIAGNOSIS — Y929 Unspecified place or not applicable: Secondary | ICD-10-CM | POA: Insufficient documentation

## 2012-01-13 DIAGNOSIS — W19XXXA Unspecified fall, initial encounter: Secondary | ICD-10-CM | POA: Insufficient documentation

## 2012-01-13 DIAGNOSIS — I10 Essential (primary) hypertension: Secondary | ICD-10-CM | POA: Insufficient documentation

## 2012-01-13 DIAGNOSIS — Z79899 Other long term (current) drug therapy: Secondary | ICD-10-CM | POA: Insufficient documentation

## 2012-01-13 MED ORDER — TOBRAMYCIN 0.3 % OP SOLN: 2.0000 [drp] | Freq: Four times a day (QID) | OPHTHALMIC | Status: DC

## 2012-01-13 MED ORDER — TOBRAMYCIN 0.3 % OP SOLN
2.0000 [drp] | Freq: Four times a day (QID) | OPHTHALMIC | Status: DC
  Administered 2012-01-13: 2 [drp] via OPHTHALMIC
  Filled 2012-01-13: qty 5

## 2012-01-13 NOTE — ED Provider Notes (Signed)
History     CSN: [id number]  Arrival date & time 01/13/12  0549   First MD Initiated Contact with Patient 01/13/12 [phone]      Chief Complaint  Patient presents with  . Fall   Level V caveat dementia. History is obtained from patient's wife over the telephone and 5 meds PAC supervisor Mendel Corning I telephoned (Consider location/radiation/quality/duration/timing/severity/associated sxs/prior treatment) Patient is a 78 y.o. male presenting with fall.  Fall   patient was found on for 4:30 AM today he complained of headache and shoulder pain when he was found. He presently is without complaint and states nothing hurts. No treatment prior to coming here. Past Medical History  Diagnosis Date  . Hypertension   . Hypercholesterolemia   . DEMENTIA   . Arthritis   . Hypothyroidism   . Neuromuscular disorder     carpal tunnel    Past Surgical History  Procedure Date  . Joint replacement   . Eye surgery     lasik both eyes  . Transurethral resection of prostate [id number]  . Shoulder arthroscopy 2005    right  . Shoulder arthroscopy 2002    rt  . Total hip arthroplasty     right and left  . Cholecystectomy   . Colonoscopy   . Carpal tunnel release 05/10/2011    Procedure: CARPAL TUNNEL RELEASE;  Surgeon: Nicki Reaper, MD;  Location: [facility];  Service: Orthopedics;  Laterality: Right;    History reviewed. No pertinent family history.  History  Substance Use Topics  . Smoking status: Never Smoker   . Smokeless tobacco: Never Used  . Alcohol Use: No     quit 2 years ago      Review of Systems  Unable to perform ROS: Dementia  Neurological:       Walks with walker    Allergies  Morphine and related and Penicillins  Home Medications   Current Outpatient Rx  Name Route Sig Dispense Refill  . ACETAMINOPHEN 325 MG PO TABS Oral Take 2 tablets (650 mg total) by mouth every 6 (six) hours as needed for pain or fever (or Fever >/= 101).    . ASPIRIN EC  81 MG PO TBEC Oral Take 81 mg by mouth daily.    Marland Kitchen VITAMIN D 2000 UNITS PO TABS Oral Take 2,000 Units by mouth daily.    Marland Kitchen DIVALPROEX SODIUM 250 MG PO TBEC Oral Take 250 mg by mouth 2 (two) times daily.    . DONEPEZIL HCL 10 MG PO TABS Oral Take 10 mg by mouth at bedtime.     Marland Kitchen FINASTERIDE 5 MG PO TABS Oral Take 5 mg by mouth daily.      Marland Kitchen HYDROCODONE-ACETAMINOPHEN 5-325 MG PO TABS Oral Take 1-2 tablets by mouth every 4 (four) hours as needed. For pain    . LEVOTHYROXINE SODIUM 50 MCG PO TABS Oral Take 50 mcg by mouth daily.    Marland Kitchen LORATADINE 10 MG PO TABS Oral Take 10 mg by mouth daily.     Marland Kitchen LORAZEPAM 0.5 MG PO TABS Oral Take 0.5 mg by mouth every 8 (eight) hours as needed. anxiety    . MEMANTINE HCL 10 MG PO TABS Oral Take 10 mg by mouth daily.    . SENNA-DOCUSATE SODIUM 8.6-50 MG PO TABS Oral Take 1 tablet by mouth as needed. For constipation    . SERTRALINE HCL 50 MG PO TABS Oral Take 50 mg by mouth at bedtime.     Marland Kitchen  TAMSULOSIN HCL 0.4 MG PO CAPS Oral Take 0.4 mg by mouth daily after supper.       BP 133/57  Pulse 62  Temp 98.7 F (37.1 C) (Oral)  Resp 15  SpO2 97%  Physical Exam  Nursing note and vitals reviewed. Constitutional: He appears well-developed and well-nourished.  HENT:  Head: Normocephalic and atraumatic.  Right Ear: External ear normal.  Left Ear: External ear normal.  Eyes: Pupils are equal, round, and reactive to light.       Mild sub conjunctival erythema right eye  Neck: Neck supple. No tracheal deviation present. No thyromegaly present.  Cardiovascular: Normal rate and regular rhythm.   No murmur heard. Pulmonary/Chest: Effort normal and breath sounds normal.  Abdominal: Soft. Bowel sounds are normal. He exhibits no distension. There is no tenderness.  Musculoskeletal: Normal range of motion. He exhibits no edema and no tenderness.       Entire spine nontender pelvis stable nontender all 4 extremities atraumatic, neurovascularly intact, nontender    Neurological: He is alert. Coordination normal.       Follow simple commands moves all extremities he will walk with assistance  Skin: Skin is warm and dry. No rash noted.  Psychiatric:       Pleasant cooperative    ED Course  Procedures (including critical care time)  Labs Reviewed - No data to display No results found.   No diagnosis found. 9:20 AM wife appeared. She states patient looks normal to her. Patient continues to deny pain anywhere. He is awake alert. Further history wife reports that he has had drainage from his right eye and right eye has appeared red the past 3 days   MDM  No diagnostic studies needed we'll treat empirically for conjunctivitis of right eye Prescription tobramycin eyedrops dx #1 fall #2 conjunctivitis right eye        Doug Sou, MD 01/13/12 [phone]

## 2012-01-13 NOTE — ED Notes (Signed)
Per EMS: Pt had an unwitnessed fall. C/o right shoulder, arm, hip and leg pain. Hx of dementia. No signs of injury.

## 2012-01-13 NOTE — ED Notes (Addendum)
Family at bedside. Expressive's concern of husband being transported 3x by EMS for every fall even though he appares  All right. Wife stated she can not afford a $600.00x one month for ambulance bill.

## 2012-01-13 NOTE — ED Notes (Signed)
Patient conscious and alert. General appearance is a weakened gentlemen follows instructions.

## 2012-01-13 NOTE — ED Notes (Signed)
[phone] w/c patient to [facility] had to stay with patient due to risk of fall.

## 2012-04-15 ENCOUNTER — Encounter (HOSPITAL_COMMUNITY): Payer: Self-pay | Admitting: Emergency Medicine

## 2012-04-15 ENCOUNTER — Emergency Department (HOSPITAL_COMMUNITY): Payer: Medicare Other

## 2012-04-15 ENCOUNTER — Emergency Department (HOSPITAL_COMMUNITY)
Admission: EM | Admit: 2012-04-15 | Discharge: 2012-04-15 | Disposition: A | Discharge status: Home / Self Care | Payer: Medicare Other | Attending: Emergency Medicine | Admitting: Emergency Medicine

## 2012-04-15 DIAGNOSIS — Z7982 Long term (current) use of aspirin: Secondary | ICD-10-CM | POA: Insufficient documentation

## 2012-04-15 DIAGNOSIS — E039 Hypothyroidism, unspecified: Secondary | ICD-10-CM | POA: Insufficient documentation

## 2012-04-15 DIAGNOSIS — R531 Weakness: Secondary | ICD-10-CM

## 2012-04-15 DIAGNOSIS — Z79899 Other long term (current) drug therapy: Secondary | ICD-10-CM | POA: Insufficient documentation

## 2012-04-15 DIAGNOSIS — E7800 Pure hypercholesterolemia, unspecified: Secondary | ICD-10-CM | POA: Insufficient documentation

## 2012-04-15 DIAGNOSIS — Z8739 Personal history of other diseases of the musculoskeletal system and connective tissue: Secondary | ICD-10-CM | POA: Insufficient documentation

## 2012-04-15 DIAGNOSIS — Z8669 Personal history of other diseases of the nervous system and sense organs: Secondary | ICD-10-CM | POA: Insufficient documentation

## 2012-04-15 DIAGNOSIS — R5383 Other fatigue: Secondary | ICD-10-CM | POA: Insufficient documentation

## 2012-04-15 DIAGNOSIS — I10 Essential (primary) hypertension: Secondary | ICD-10-CM | POA: Insufficient documentation

## 2012-04-15 DIAGNOSIS — F0390 Unspecified dementia without behavioral disturbance: Secondary | ICD-10-CM | POA: Insufficient documentation

## 2012-04-15 DIAGNOSIS — R5381 Other malaise: Secondary | ICD-10-CM | POA: Insufficient documentation

## 2012-04-15 LAB — CBC WITH DIFFERENTIAL/PLATELET
Basophils Absolute: 0 10*3/uL (ref 0.0–0.1)
Eosinophils Absolute: 0.1 10*3/uL (ref 0.0–0.7)
Eosinophils Relative: 3 % (ref 0–5)
Lymphocytes Relative: 21 % (ref 12–46)
MCV: 98.6 fL (ref 78.0–100.0)
Platelets: 206 10*3/uL (ref 150–400)
RDW: 13.5 % (ref 11.5–15.5)
WBC: 5.7 10*3/uL (ref 4.0–10.5)

## 2012-04-15 LAB — URINALYSIS, ROUTINE W REFLEX MICROSCOPIC
Hgb urine dipstick: NEGATIVE
Specific Gravity, Urine: 1.022 (ref 1.005–1.030)
pH: 6 (ref 5.0–8.0)

## 2012-04-15 LAB — COMPREHENSIVE METABOLIC PANEL
ALT: 13 U/L (ref 0–53)
AST: 22 U/L (ref 0–37)
Albumin: 3.4 g/dL — ABNORMAL LOW (ref 3.5–5.2)
Calcium: 9.2 mg/dL (ref 8.4–10.5)
Sodium: 138 mEq/L (ref 135–145)
Total Protein: 6.5 g/dL (ref 6.0–8.3)

## 2012-04-15 LAB — GLUCOSE, CAPILLARY

## 2012-04-15 NOTE — ED Notes (Signed)
[facility] here to pick up patient.  Patient watching TV, up in chair eating graham crackers.

## 2012-04-15 NOTE — ED Provider Notes (Signed)
History     CSN: [id number]  Arrival date & time 04/15/12  1353   First MD Initiated Contact with Patient 04/15/12 1412      No chief complaint on file.   (Consider location/radiation/quality/duration/timing/severity/associated sxs/prior treatment) HPI  Robert Preston is a 79 y.o. male pleasantly demented resident of [facility] memory care unit sent by the unit because he is acting increasingly confused. Level V caveat secondary to dementia.  Past Medical History  Diagnosis Date  . Hypertension   . Hypercholesterolemia   . DEMENTIA   . Arthritis   . Hypothyroidism   . Neuromuscular disorder     carpal tunnel    Past Surgical History  Procedure Date  . Joint replacement   . Eye surgery     lasik both eyes  . Transurethral resection of prostate [id number]  . Shoulder arthroscopy 2005    right  . Shoulder arthroscopy 2002    rt  . Total hip arthroplasty     right and left  . Cholecystectomy   . Colonoscopy   . Carpal tunnel release 05/10/2011    Procedure: CARPAL TUNNEL RELEASE;  Surgeon: Nicki Reaper, MD;  Location: [facility];  Service: Orthopedics;  Laterality: Right;    No family history on file.  History  Substance Use Topics  . Smoking status: Never Smoker   . Smokeless tobacco: Never Used  . Alcohol Use: No     Comment: quit 2 years ago      Review of Systems  Unable to perform ROS: Dementia    Allergies  Morphine and related and Penicillins  Home Medications   Current Outpatient Rx  Name  Route  Sig  Dispense  Refill  . ACETAMINOPHEN 325 MG PO TABS   Oral   Take 2 tablets (650 mg total) by mouth every 6 (six) hours as needed for pain or fever (or Fever >/= 101).         . ASPIRIN EC 81 MG PO TBEC   Oral   Take 81 mg by mouth daily.         Marland Kitchen DIVALPROEX SODIUM 250 MG PO TBEC   Oral   Take 250 mg by mouth 2 (two) times daily.         . DONEPEZIL HCL 10 MG PO TABS   Oral   Take 10 mg by mouth at bedtime.           Marland Kitchen FINASTERIDE 5 MG PO TABS   Oral   Take 5 mg by mouth daily.           Marland Kitchen LEVOTHYROXINE SODIUM 50 MCG PO TABS   Oral   Take 50 mcg by mouth daily.         Marland Kitchen LORATADINE 10 MG PO TABS   Oral   Take 10 mg by mouth daily.          Marland Kitchen LORAZEPAM 0.5 MG PO TABS   Oral   Take 0.5 mg by mouth every 8 (eight) hours as needed. anxiety         . MEMANTINE HCL 10 MG PO TABS   Oral   Take 10 mg by mouth daily.         . SENNA-DOCUSATE SODIUM 8.6-50 MG PO TABS   Oral   Take 1 tablet by mouth as needed. For constipation         . SERTRALINE HCL 50 MG PO TABS   Oral   Take  50 mg by mouth every morning.          Marland Kitchen TAMSULOSIN HCL 0.4 MG PO CAPS   Oral   Take 0.4 mg by mouth every morning.            BP 142/57  Pulse 54  Temp 97.8 F (36.6 C) (Rectal)  Resp 21  SpO2 98%  Physical Exam  Nursing note and vitals reviewed. Constitutional: He is oriented to person, place, and time. He appears well-developed and well-nourished. No distress.  HENT:  Head: Normocephalic and atraumatic.  Mouth/Throat: Oropharynx is clear and moist.  Eyes: Conjunctivae normal and EOM are normal. Pupils are equal, round, and reactive to light.  Neck: Normal range of motion.  Cardiovascular: Normal rate.   Pulmonary/Chest: Effort normal and breath sounds normal. No stridor.  Abdominal: Soft. Bowel sounds are normal. He exhibits no distension and no mass. There is no tenderness. There is no rebound and no guarding.  Musculoskeletal: Normal range of motion. He exhibits no edema.       Strength is 4/5x5 extremities, distal sensation is grossly intact.  Neurological: He is alert and oriented to person, place, and time.  Psychiatric: He has a normal mood and affect.    ED Course  Procedures (including critical care time)  Labs Reviewed  CBC WITH DIFFERENTIAL - Abnormal; Notable for the following:    RBC 3.63 (*)     Hemoglobin 11.9 (*)     HCT 35.8 (*)     Monocytes Relative 16 (*)      All other components within normal limits  COMPREHENSIVE METABOLIC PANEL - Abnormal; Notable for the following:    Glucose, Bld 110 (*)     Albumin 3.4 (*)     GFR calc non Af Amer 65 (*)     GFR calc Af Amer 75 (*)     All other components within normal limits  URINALYSIS, ROUTINE W REFLEX MICROSCOPIC - Abnormal; Notable for the following:    Bilirubin Urine SMALL (*)     Ketones, ur TRACE (*)     All other components within normal limits  GLUCOSE, CAPILLARY - Abnormal; Notable for the following:    Glucose-Capillary 117 (*)     All other components within normal limits   Dg Chest 2 View  04/15/2012  *RADIOLOGY REPORT*  Clinical Data: Altered mental status.  CHEST - 2 VIEW  Comparison: 12/29/2011  Findings: The patient is rotated to the right on today's exam, resulting in reduced diagnostic sensitivity and specificity. Stable mild cardiomegaly noted.  The lungs appear clear. Degenerative glenohumeral arthropathy noted.  Thoracic spondylosis is present.  IMPRESSION:  1.  Stable mild cardiomegaly.  The lungs appear clear. 2.  Thoracic spondylosis. 3.  Degenerative glenohumeral arthropathy.   Original Report Authenticated By: Gaylyn Rong, M.D.    Ct Head Wo Contrast  04/15/2012  *RADIOLOGY REPORT*  Clinical Data: Mental status and confusion  CT HEAD WITHOUT CONTRAST  Technique:  Contiguous axial images were obtained from the base of the skull through the vertex without contrast.  Comparison: 12/29/2011  Findings: The brain stem, cerebellum, cerebral peduncles, thalami, and basal ganglia appear normal. Periventricular and corona radiata white matter hypodensities are most compatible with chronic ischemic microvascular white matter disease.  Ex vacuo prominence the lateral ventricles noted.  No  There is failure of fusion of the posterior arch of C1. No intracranial hemorrhage, mass lesion, or acute infarction is identified.  Small mucous retention cyst noted in the  left maxillary sinus.  Atherosclerotic calcification of the carotid siphons noted.  IMPRESSION:  1. Periventricular and corona radiata white matter hypodensities are most compatible with chronic ischemic microvascular white matter disease.  2.  Mild chronic left maxillary sinusitis.   Original Report Authenticated By: Gaylyn Rong, M.D.     Date: 04/15/2012  Rate: 55  Rhythm: sinus bradycardia  QRS Axis: right  Intervals: PR prolonged  ST/T Wave abnormalities: nonspecific T wave changes  Conduction Disutrbances:first-degree A-V block   Narrative Interpretation:   Old EKG Reviewed: unchanged     1. Dementia   2. Weakness generalized       MDM  Pleasantly demented man sent by memory care unit for evaluation of increasing confusion. He should is verbal and oriented to person and place. Physical exam is unremarkable except for a generalized weakness. Rectal temperature is normal. Vitals otherwise stable.   Blood work, urinalysis, head CT and chest x-ray are all normal. CT shows chronic Ischemic microvascular disease  Filed Vitals:   04/15/12 1359 04/15/12 1411 04/15/12 1450  BP: 154/80 142/57   Pulse: 64 54   Temp:  98.2 F (36.8 C) 97.8 F (36.6 C)  TempSrc:  Oral Rectal  Resp: 16 21   SpO2:  98%    This is a shared visit with attending who agrees with care plan and stability for discharge to home. Patient in place without difficulty.  Discussed findings and plan with patient's wife and daughter. Patient has outpatient followup and return precautions given verbalized understanding  by family members     Wynetta Emery, PA-C 04/15/12 1920

## 2012-04-15 NOTE — ED Notes (Signed)
Pt not complain of pain but does complain of being weak in the knees. Nurse from [facility] memory care states he has not been up and walking around the past 2 days.

## 2012-04-15 NOTE — ED Notes (Signed)
ZOX:[id number]<AV> Expected date:<BR> Expected time:<BR> Means of arrival:<BR> Comments:<BR> Ems/ nursing home not acting normal

## 2012-04-15 NOTE — ED Notes (Signed)
Pt alert states he feels different that usual. Says he  Feels like his mental state is not like it usually is. Says he is having a hard time thinking. Denies dysuria.

## 2012-04-15 NOTE — Progress Notes (Signed)
CSW met with pt and pt wife at bedside. CSW confirmed that patient is a resident at [facility]. Per discussion with pt wife, pt is on the waitlist at [facility] for an assisted living bed. Pt wife is also actively looking for another placement for patient. Pt wife stated that plan is for patient to return to [facility] when medically stable and she will continue to pursue further placement for patient.   Catha Gosselin, LCSWA  [phone] .04/15/2012 1639pm

## 2012-04-15 NOTE — ED Notes (Signed)
[facility] called  

## 2012-04-18 NOTE — ED Provider Notes (Signed)
Medical screening examination/treatment/procedure(s) were conducted as a shared visit with non-physician practitioner(s) and myself.  I personally evaluated the patient during the encounter  Ethelda Chick, MD 04/18/12 [phone]

## 2012-04-25 ENCOUNTER — Emergency Department (HOSPITAL_COMMUNITY): Payer: Medicare Other

## 2012-04-25 ENCOUNTER — Encounter (HOSPITAL_COMMUNITY): Payer: Self-pay | Admitting: *Deleted

## 2012-04-25 ENCOUNTER — Emergency Department (HOSPITAL_COMMUNITY)
Admission: EM | Admit: 2012-04-25 | Discharge: 2012-04-25 | Disposition: A | Discharge status: Home / Self Care | Payer: Medicare Other | Attending: Emergency Medicine | Admitting: Emergency Medicine

## 2012-04-25 DIAGNOSIS — S161XXA Strain of muscle, fascia and tendon at neck level, initial encounter: Secondary | ICD-10-CM

## 2012-04-25 DIAGNOSIS — Z7982 Long term (current) use of aspirin: Secondary | ICD-10-CM | POA: Insufficient documentation

## 2012-04-25 DIAGNOSIS — Z79899 Other long term (current) drug therapy: Secondary | ICD-10-CM | POA: Insufficient documentation

## 2012-04-25 DIAGNOSIS — Z8739 Personal history of other diseases of the musculoskeletal system and connective tissue: Secondary | ICD-10-CM | POA: Insufficient documentation

## 2012-04-25 DIAGNOSIS — M1990 Unspecified osteoarthritis, unspecified site: Secondary | ICD-10-CM | POA: Insufficient documentation

## 2012-04-25 DIAGNOSIS — E039 Hypothyroidism, unspecified: Secondary | ICD-10-CM | POA: Insufficient documentation

## 2012-04-25 DIAGNOSIS — Z8719 Personal history of other diseases of the digestive system: Secondary | ICD-10-CM | POA: Insufficient documentation

## 2012-04-25 DIAGNOSIS — Y9210 Unspecified residential institution as the place of occurrence of the external cause: Secondary | ICD-10-CM | POA: Insufficient documentation

## 2012-04-25 DIAGNOSIS — M129 Arthropathy, unspecified: Secondary | ICD-10-CM | POA: Insufficient documentation

## 2012-04-25 DIAGNOSIS — Y9389 Activity, other specified: Secondary | ICD-10-CM | POA: Insufficient documentation

## 2012-04-25 DIAGNOSIS — E7800 Pure hypercholesterolemia, unspecified: Secondary | ICD-10-CM | POA: Insufficient documentation

## 2012-04-25 DIAGNOSIS — S139XXA Sprain of joints and ligaments of unspecified parts of neck, initial encounter: Secondary | ICD-10-CM | POA: Insufficient documentation

## 2012-04-25 DIAGNOSIS — W07XXXA Fall from chair, initial encounter: Secondary | ICD-10-CM | POA: Insufficient documentation

## 2012-04-25 DIAGNOSIS — I10 Essential (primary) hypertension: Secondary | ICD-10-CM | POA: Insufficient documentation

## 2012-04-25 DIAGNOSIS — W19XXXA Unspecified fall, initial encounter: Secondary | ICD-10-CM

## 2012-04-25 HISTORY — DX: Unspecified osteoarthritis, unspecified site: M19.90

## 2012-04-25 HISTORY — DX: Gastro-esophageal reflux disease without esophagitis: K21.9

## 2012-04-25 NOTE — ED Provider Notes (Addendum)
History     CSN: [id number]  Arrival date & time 04/25/12  0421   First MD Initiated Contact with Patient 04/25/12 [phone]      Chief Complaint  Patient presents with  . Fall  . Neck Pain    (Consider location/radiation/quality/duration/timing/severity/associated sxs/prior treatment) Patient is a 79 y.o. male presenting with fall and neck pain. The history is provided by the patient, the EMS personnel and the nursing home.  Fall The accident occurred 1 to 2 hours ago. Fall occurred: Lying in his recliner in slid out onto the floor hitting his head/neck on the foot the chair. He fell from a height of 1 to 2 ft. He landed on a hard floor. There was no blood loss. The point of impact was the neck. The pain is present in the neck. The pain is at a severity of 1/10. The pain is mild. He was not ambulatory at the scene. Pertinent negatives include no numbness, no abdominal pain, no headaches, no loss of consciousness and no tingling. The symptoms are aggravated by pressure on the injury. Treatment on scene includes a c-collar and a backboard. He has tried immobilization for the symptoms. The treatment provided no relief.  Neck Pain Associated symptoms: no headaches, no numbness, no tingling and no weakness     Past Medical History  Diagnosis Date  . Hypertension   . Hypercholesterolemia   . DEMENTIA   . Arthritis   . Hypothyroidism   . Neuromuscular disorder     carpal tunnel  . GERD (gastroesophageal reflux disease)   . DJD (degenerative joint disease)     Past Surgical History  Procedure Laterality Date  . Joint replacement    . Eye surgery      lasik both eyes  . Transurethral resection of prostate  [id number]  . Shoulder arthroscopy  2005    right  . Shoulder arthroscopy  2002    rt  . Total hip arthroplasty      right and left  . Cholecystectomy    . Colonoscopy    . Carpal tunnel release  05/10/2011    Procedure: CARPAL TUNNEL RELEASE;  Surgeon: Nicki Reaper, MD;   Location: [facility];  Service: Orthopedics;  Laterality: Right;    No family history on file.  History  Substance Use Topics  . Smoking status: Never Smoker   . Smokeless tobacco: Never Used  . Alcohol Use: No     Comment: quit 2 years ago      Review of Systems  HENT: Positive for neck pain.   Gastrointestinal: Negative for abdominal pain.  Neurological: Negative for tingling, loss of consciousness, weakness, numbness and headaches.  Psychiatric/Behavioral:       History of dementia  All other systems reviewed and are negative.    Allergies  Morphine and related and Penicillins  Home Medications   Current Outpatient Rx  Name  Route  Sig  Dispense  Refill  . acetaminophen (TYLENOL) 325 MG tablet   Oral   Take 2 tablets (650 mg total) by mouth every 6 (six) hours as needed for pain or fever (or Fever >/= 101).         Marland Kitchen aspirin EC 81 MG tablet   Oral   Take 81 mg by mouth daily.         . divalproex (DEPAKOTE) 250 MG DR tablet   Oral   Take 250 mg by mouth 2 (two) times daily.         Marland Kitchen  donepezil (ARICEPT) 10 MG tablet   Oral   Take 10 mg by mouth at bedtime.          . finasteride (PROSCAR) 5 MG tablet   Oral   Take 5 mg by mouth daily.           Marland Kitchen levothyroxine (SYNTHROID, LEVOTHROID) 50 MCG tablet   Oral   Take 50 mcg by mouth daily.         Marland Kitchen loratadine (CLARITIN) 10 MG tablet   Oral   Take 10 mg by mouth daily.          Marland Kitchen LORazepam (ATIVAN) 0.5 MG tablet   Oral   Take 0.5 mg by mouth every 8 (eight) hours as needed. anxiety         . memantine (NAMENDA) 10 MG tablet   Oral   Take 10 mg by mouth daily.         . sennosides-docusate sodium (SENOKOT-S) 8.6-50 MG tablet   Oral   Take 1 tablet by mouth as needed. For constipation         . sertraline (ZOLOFT) 50 MG tablet   Oral   Take 50 mg by mouth every morning.          . Tamsulosin HCl (FLOMAX) 0.4 MG CAPS   Oral   Take 0.4 mg by mouth every  morning.            BP 164/68  Pulse 56  Temp(Src) 98.4 F (36.9 C) (Oral)  Resp 20  SpO2 96%  Physical Exam  Nursing note and vitals reviewed. Constitutional: He is oriented to person, place, and time. He appears well-developed and well-nourished. No distress.  HENT:  Head: Normocephalic and atraumatic.  Mouth/Throat: Oropharynx is clear and moist.  Eyes: Conjunctivae and EOM are normal. Pupils are equal, round, and reactive to light.  Neck: Normal range of motion. Neck supple. Spinous process tenderness present. No muscular tenderness present.  Tenderness over C2/3  Cardiovascular: Normal rate.   Pulmonary/Chest: Effort normal. No respiratory distress. He exhibits no tenderness.  Abdominal: Soft. He exhibits no distension. There is no tenderness. There is no rebound and no guarding.  Musculoskeletal: Normal range of motion. He exhibits no edema and no tenderness.       Thoracic back: Normal.       Lumbar back: Normal.  Neurological: He is alert and oriented to person, place, and time. He has normal strength. No sensory deficit.  Skin: Skin is warm and dry. No rash noted. No erythema.  Psychiatric: He has a normal mood and affect. His behavior is normal.    ED Course  Procedures (including critical care time)  Labs Reviewed - No data to display Ct Cervical Spine Wo Contrast  04/25/2012  *RADIOLOGY REPORT*  Clinical Data: Status post fall; neck pain.  CT CERVICAL SPINE WITHOUT CONTRAST  Technique:  Multidetector CT imaging of the cervical spine was performed. Multiplanar CT image reconstructions were also generated.  Comparison: CT of the cervical spine performed 12/29/2011  Findings: There is no evidence of fracture or subluxation. Vertebral bodies demonstrate normal height and alignment. Multilevel disc space narrowing is noted along the cervical spine, with associated anterior and posterior disc osteophyte complexes. Underlying facet disease is noted.  Prevertebral soft  tissues are within normal limits.  There is incomplete fusion of the posterior arch of C1.  The visualized portion of the thyroid gland are unremarkable in appearance.  The visualized lung apices are clear.  Mild calcification is noted at the carotid bifurcations bilaterally. The visualized portions of the cerebellum are unremarkable in appearance.  IMPRESSION:  1.  No evidence of fracture or subluxation along the cervical spine. 2.  Mild diffuse degenerative change noted along the cervical spine. 3.  Mild calcification at the carotid bifurcations bilaterally.   Original Report Authenticated By: Tonia Ghent, M.D.      1. Fall   2. Cervical strain       MDM   Patient with a mechanical fall today at the nursing home when he slid out of his recliner hitting his head on the end of the foot rest. He was complaining of neck pain but no LOC. On exam there is no obvious signs of injury he is neurovascularly intact. He has normal strength in his upper and lower strength his. Mild tenderness to the C2/C3 region. CT of the C-spine pending.  Patient denies LOC or hitting his head. He is not on anticoagulation.  5:51 AM Films negative. C-spine cleared and patient discharged     Gwyneth Sprout, MD 04/25/12 1191  Gwyneth Sprout, MD 04/25/12 [phone]

## 2012-04-25 NOTE — ED Notes (Signed)
PT c/o neck pain; no obvious injury; tender to palpation; C-Collar remains in place.

## 2012-04-25 NOTE — ED Notes (Signed)
PT to ER via [facility] from [facility]; EMS reports from staff at facility that pt was sitting up in a recliner chair and leaned forward and slid out of chair; pt struck head on the base of the chair; no evidence of trauma to back of head, pt c/o neck pain; pt in C-Collar from EMS

## 2012-04-25 NOTE — ED Notes (Signed)
ZOX:[id number]<AV> Expected date:<BR> Expected time:<BR> Means of arrival:<BR> Comments:<BR> EMS/elderly from facility-fall

## 2012-04-25 NOTE — ED Notes (Signed)
[facility] arrived to transport pt to [facility]

## 2012-05-31 ENCOUNTER — Emergency Department (HOSPITAL_COMMUNITY): Payer: Medicare Other

## 2012-05-31 ENCOUNTER — Emergency Department (HOSPITAL_COMMUNITY)
Admission: EM | Admit: 2012-05-31 | Discharge: 2012-06-01 | Disposition: A | Discharge status: Skilled Nursing Facility | Payer: Medicare Other | Attending: Emergency Medicine | Admitting: Emergency Medicine

## 2012-05-31 ENCOUNTER — Encounter (HOSPITAL_COMMUNITY): Payer: Self-pay

## 2012-05-31 DIAGNOSIS — Z862 Personal history of diseases of the blood and blood-forming organs and certain disorders involving the immune mechanism: Secondary | ICD-10-CM | POA: Insufficient documentation

## 2012-05-31 DIAGNOSIS — Z8669 Personal history of other diseases of the nervous system and sense organs: Secondary | ICD-10-CM | POA: Insufficient documentation

## 2012-05-31 DIAGNOSIS — E039 Hypothyroidism, unspecified: Secondary | ICD-10-CM | POA: Insufficient documentation

## 2012-05-31 DIAGNOSIS — Z79899 Other long term (current) drug therapy: Secondary | ICD-10-CM | POA: Insufficient documentation

## 2012-05-31 DIAGNOSIS — Z8719 Personal history of other diseases of the digestive system: Secondary | ICD-10-CM | POA: Insufficient documentation

## 2012-05-31 DIAGNOSIS — Z7982 Long term (current) use of aspirin: Secondary | ICD-10-CM | POA: Insufficient documentation

## 2012-05-31 DIAGNOSIS — F0390 Unspecified dementia without behavioral disturbance: Secondary | ICD-10-CM

## 2012-05-31 DIAGNOSIS — I10 Essential (primary) hypertension: Secondary | ICD-10-CM | POA: Insufficient documentation

## 2012-05-31 DIAGNOSIS — Z8639 Personal history of other endocrine, nutritional and metabolic disease: Secondary | ICD-10-CM | POA: Insufficient documentation

## 2012-05-31 DIAGNOSIS — Z8739 Personal history of other diseases of the musculoskeletal system and connective tissue: Secondary | ICD-10-CM | POA: Insufficient documentation

## 2012-05-31 DIAGNOSIS — R4182 Altered mental status, unspecified: Secondary | ICD-10-CM

## 2012-05-31 LAB — COMPREHENSIVE METABOLIC PANEL WITH GFR
Albumin: 3.2 g/dL — ABNORMAL LOW (ref 3.5–5.2)
Alkaline Phosphatase: 73 U/L (ref 39–117)
BUN: 22 mg/dL (ref 6–23)
Calcium: 8.9 mg/dL (ref 8.4–10.5)
Creatinine, Ser: 0.95 mg/dL (ref 0.50–1.35)
GFR calc Af Amer: 84 mL/min — ABNORMAL LOW (ref >90)
Glucose, Bld: 89 mg/dL (ref 70–99)
Potassium: 4.3 meq/L (ref 3.5–5.1)
Total Protein: 6.7 g/dL (ref 6.0–8.3)

## 2012-05-31 LAB — CBC WITH DIFFERENTIAL/PLATELET
Basophils Absolute: 0 10*3/uL (ref 0.0–0.1)
Basophils Relative: 0 % (ref 0–1)
Eosinophils Absolute: 0.2 K/uL (ref 0.0–0.7)
Eosinophils Relative: 3 % (ref 0–5)
HCT: 32.5 % — ABNORMAL LOW (ref 39.0–52.0)
Hemoglobin: 10.7 g/dL — ABNORMAL LOW (ref 13.0–17.0)
Lymphocytes Relative: 19 % (ref 12–46)
Lymphs Abs: 1.3 K/uL (ref 0.7–4.0)
MCH: 32.6 pg (ref 26.0–34.0)
MCHC: 32.9 g/dL (ref 30.0–36.0)
MCV: 99.1 fL (ref 78.0–100.0)
Monocytes Absolute: 0.6 K/uL (ref 0.1–1.0)
Monocytes Relative: 8 % (ref 3–12)
Neutro Abs: 4.9 10*3/uL (ref 1.7–7.7)
Neutrophils Relative %: 70 % (ref 43–77)
Platelets: 215 10*3/uL (ref 150–400)
RBC: 3.28 MIL/uL — ABNORMAL LOW (ref 4.22–5.81)
RDW: 12.9 % (ref 11.5–15.5)
WBC: 7.1 10*3/uL (ref 4.0–10.5)

## 2012-05-31 LAB — BLOOD GAS, VENOUS
Acid-Base Excess: 7.5 mmol/L — ABNORMAL HIGH (ref 0.0–2.0)
Bicarbonate: 33.8 meq/L — ABNORMAL HIGH (ref 20.0–24.0)
FIO2: 0.21 %
O2 Saturation: 33.4 %
Patient temperature: 98.6
TCO2: 31.6 mmol/L (ref 0–100)
pCO2, Ven: 59.1 mmHg — ABNORMAL HIGH (ref 45.0–50.0)
pH, Ven: 7.376 — ABNORMAL HIGH (ref 7.250–7.300)
pO2, Ven: 23.1 mmHg — CL (ref 30.0–45.0)

## 2012-05-31 LAB — COMPREHENSIVE METABOLIC PANEL
ALT: 10 U/L (ref 0–53)
AST: 20 U/L (ref 0–37)
CO2: 31 mEq/L (ref 19–32)
Chloride: 101 mEq/L (ref 96–112)
GFR calc non Af Amer: 73 mL/min — ABNORMAL LOW (ref >90)
Sodium: 139 mEq/L (ref 135–145)
Total Bilirubin: 0.2 mg/dL — ABNORMAL LOW (ref 0.3–1.2)

## 2012-05-31 LAB — URINE MICROSCOPIC-ADD ON

## 2012-05-31 LAB — URINALYSIS, ROUTINE W REFLEX MICROSCOPIC
Bilirubin Urine: NEGATIVE
Glucose, UA: NEGATIVE mg/dL
Ketones, ur: NEGATIVE mg/dL
Leukocytes, UA: NEGATIVE
Nitrite: NEGATIVE
Protein, ur: NEGATIVE mg/dL
Specific Gravity, Urine: 1.019 (ref 1.005–1.030)
Urobilinogen, UA: 0.2 mg/dL (ref 0.0–1.0)
pH: 6.5 (ref 5.0–8.0)

## 2012-05-31 LAB — TROPONIN I: Troponin I: <0.3 ng/mL (ref <0.30)

## 2012-05-31 LAB — PRO B NATRIURETIC PEPTIDE: Pro B Natriuretic peptide (BNP): 257.8 pg/mL (ref 0–450)

## 2012-05-31 MED ORDER — SODIUM CHLORIDE 0.9 % IV BOLUS (SEPSIS)
500.0000 mL | Freq: Once | INTRAVENOUS | Status: AC
  Administered 2012-05-31: 500 mL via INTRAVENOUS

## 2012-05-31 NOTE — ED Provider Notes (Signed)
History     CSN: [id number]  Arrival date & time 05/31/12  1658   First MD Initiated Contact with Patient 05/31/12 1716      Chief Complaint  Patient presents with  . Altered Mental Status    (Consider location/radiation/quality/duration/timing/severity/associated sxs/prior treatment) HPI Comments: Level 5 caveat due to altered mentation and dementia.  Pt reportedly is more lethargic per family according to EMS notes.  Pt with h/o dementia at baseline.  Pt reports he lost his temper, then begins telling me about stories from his childhood and his religion.  He denies HA, blurred vision, neck pain, coughing, nausea.  He denies any current CP, abd pain, back pain.  He is unsure about his appetite and whether he has been eating.  Pt's family is not yet here.  Pt was seen in the ED on 2/13 with a fall and had neck pain at the time.    Patient is a 79 y.o. male presenting with altered mental status. The history is provided by the patient and medical records.  Altered Mental Status    Past Medical History  Diagnosis Date  . Hypertension   . Hypercholesterolemia   . DEMENTIA   . Arthritis   . Hypothyroidism   . Neuromuscular disorder     carpal tunnel  . GERD (gastroesophageal reflux disease)   . DJD (degenerative joint disease)     Past Surgical History  Procedure Laterality Date  . Joint replacement    . Eye surgery      lasik both eyes  . Transurethral resection of prostate  [id number]  . Shoulder arthroscopy  2005    right  . Shoulder arthroscopy  2002    rt  . Total hip arthroplasty      right and left  . Cholecystectomy    . Colonoscopy    . Carpal tunnel release  05/10/2011    Procedure: CARPAL TUNNEL RELEASE;  Surgeon: Nicki Reaper, MD;  Location: [facility];  Service: Orthopedics;  Laterality: Right;    No family history on file.  History  Substance Use Topics  . Smoking status: Never Smoker   . Smokeless tobacco: Never Used  . Alcohol Use: No      Comment: quit 2 years ago      Review of Systems  Unable to perform ROS: Dementia  Psychiatric/Behavioral: Positive for altered mental status.    Allergies  Morphine and related and Penicillins  Home Medications   Current Outpatient Rx  Name  Route  Sig  Dispense  Refill  . aspirin EC 81 MG tablet   Oral   Take 81 mg by mouth every morning.          . cholecalciferol (VITAMIN D) 1000 UNITS tablet   Oral   Take 1,000 Units by mouth every morning. Takes also with 5000 units tablets         . Cholecalciferol (VITAMIN D-3) 5000 UNITS TABS   Oral   Take 1 tablet by mouth every morning.         . divalproex (DEPAKOTE) 250 MG DR tablet   Oral   Take 250 mg by mouth 2 (two) times daily. At 8am and 8pm         . donepezil (ARICEPT) 10 MG tablet   Oral   Take 10 mg by mouth every morning.          . finasteride (PROSCAR) 5 MG tablet   Oral   Take 5  mg by mouth every morning.          Marland Kitchen levothyroxine (SYNTHROID, LEVOTHROID) 50 MCG tablet   Oral   Take 50 mcg by mouth every morning.          . loratadine (CLARITIN) 10 MG tablet   Oral   Take 10 mg by mouth every morning.          . memantine (NAMENDA) 10 MG tablet   Oral   Take 10 mg by mouth every morning.          . sertraline (ZOLOFT) 50 MG tablet   Oral   Take 50 mg by mouth every morning.          . Tamsulosin HCl (FLOMAX) 0.4 MG CAPS   Oral   Take 0.4 mg by mouth every morning.          . terazosin (HYTRIN) 2 MG capsule   Oral   Take 2 mg by mouth at bedtime.           BP 146/62  Pulse 65  Temp(Src) 97.8 F (36.6 C) (Oral)  Resp 14  SpO2 96%  Physical Exam  Nursing note and vitals reviewed. Constitutional: He appears well-developed and well-nourished.  HENT:  Head: Normocephalic and atraumatic.  Eyes: EOM are normal. Pupils are equal, round, and reactive to light.  Neck: Neck supple. JVD present. No tracheal deviation present.  Cardiovascular: Normal rate and  regular rhythm.   No murmur heard. Pulmonary/Chest: Effort normal. No stridor. No respiratory distress. He has no wheezes. He has no rales.  Abdominal: Soft. He exhibits no distension. There is no tenderness. There is no rebound and no guarding.  Musculoskeletal: He exhibits edema.  Skin: Skin is warm. No rash noted.  Psychiatric: His affect is labile. His speech is tangential. He is slowed. He is not combative. Thought content is delusional. He exhibits abnormal recent memory.    ED Course  Procedures (including critical care time)  Labs Reviewed  CBC WITH DIFFERENTIAL - Abnormal; Notable for the following:    RBC 3.28 (*)    Hemoglobin 10.7 (*)    HCT 32.5 (*)    All other components within normal limits  COMPREHENSIVE METABOLIC PANEL - Abnormal; Notable for the following:    Albumin 3.2 (*)    Total Bilirubin 0.2 (*)    GFR calc non Af Amer 73 (*)    GFR calc Af Amer 84 (*)    All other components within normal limits  URINALYSIS, ROUTINE W REFLEX MICROSCOPIC - Abnormal; Notable for the following:    Hgb urine dipstick LARGE (*)    All other components within normal limits  BLOOD GAS, VENOUS - Abnormal; Notable for the following:    pH, Ven 7.376 (*)    pCO2, Ven 59.1 (*)    pO2, Ven 23.1 (*)    Bicarbonate 33.8 (*)    Acid-Base Excess 7.5 (*)    All other components within normal limits  PRO B NATRIURETIC PEPTIDE  TROPONIN I  URINE MICROSCOPIC-ADD ON   Dg Chest 2 View  05/31/2012  *RADIOLOGY REPORT*  Clinical Data: Altered mental status.  CHEST - 2 VIEW  Comparison: 04/15/2012 and prior chest radiograph  Findings: The cardiomediastinal silhouette is unremarkable. Mild peribronchial thickening is again noted. There is no evidence of focal airspace disease, pulmonary edema, suspicious pulmonary nodule/mass, pleural effusion, or pneumothorax. No acute bony abnormalities are identified.  IMPRESSION: No evidence of active cardiopulmonary disease.   Original  Report Authenticated  By: Harmon Pier, M.D.    Ct Head Wo Contrast  05/31/2012  *RADIOLOGY REPORT*  Clinical Data: Altered mental status, lethargy  CT HEAD WITHOUT CONTRAST  Technique:  Contiguous axial images were obtained from the base of the skull through the vertex without contrast.  Comparison: 04/15/2012  Findings: No skull fracture is noted.  No intracranial hemorrhage, mass effect or midline shift.  Stable periventricular and subcortical chronic white matter disease.  No acute infarction.  Stable cerebral atrophy.  No mass lesion is noted on this unenhanced scan. Ventricular size is stable from prior exam.  IMPRESSION: No acute intracranial abnormality.  Stable atrophy and chronic white matter disease.   Original Report Authenticated By: Natasha Mead, M.D.      1. Dementia   2. Altered mental state     ra sat is 100% and I interpret to be normal  ECG at time 17:41 shows S bradycarida at rate 53, normal axis, poor r wave progression leads V1-V3.   9:00 PM Work up is neg here for any acute infection, no suspected stroke.  It turns out after speaking to daughter that pt has had numerous similar episodes of being deeply asleep, not responding, but with stable vitals signs, then will wake up and be back to baseline.  Pt has a neurologist in [place] that thinks could be seizures or TIA's, but because of age and he is already on depakote, no need for further eval.  I encouraged family to see Dr. Hyacinth Meeker again for further eval since every time this occurs, his facility is sending him to the ED, if they were instructed on how to handle these episodes, it would save on future visits to the ED potentially.    Also, pt will be having to leave [facility] next week and unable to care for pt at home, daughter has been working hard to try to find a SNF for pt with no success.  Will ask our social work try to help pt, even if just as outpt.      MDM  Pt with reportedly lethargy and altered mentation.  Pt with h/o dementia.   Unable to get adequate history from pt.  He is awake, alert, conversant, although only oriented to self at present.  No complaints of pain, SOB.          Gavin Pound. Oletta Lamas, MD 05/31/12 2336

## 2012-05-31 NOTE — ED Notes (Signed)
[facility] called for pt.'s transport back to [facility].

## 2012-05-31 NOTE — Discharge Instructions (Signed)
 Altered Mental Status Altered mental status most often refers to an abnormal change in your responsiveness and awareness. It can affect your speech, thought, mobility, memory, attention span, or alertness. It can range from slight confusion to complete unresponsiveness (coma). Altered mental status can be a sign of a serious underlying medical condition. Rapid evaluation and medical treatment is necessary for patients having an altered mental status. CAUSES   Low blood sugar (hypoglycemia) or diabetes.  Severe loss of body fluids (dehydration) or a body salt (electrolyte) imbalance.  A stroke or other neurologic problem, such as dementia or delirium.  A head injury or tumor.  A drug or alcohol overdose.  Exposure to toxins or poisons.  Depression, anxiety, and stress.  A low oxygen level (hypoxia).  An infection.  Blood loss.  Twitching or shaking (seizure).  Heart problems, such as heart attack or heart rhythm problems (arrhythmias).  A body temperature that is too low or too high (hypothermia or hyperthermia). DIAGNOSIS  A diagnosis is based on your history, symptoms, physical and neurologic examinations, and diagnostic tests. Diagnostic tests may include:  Measurement of your blood pressure, pulse, breathing, and oxygen levels (vital signs).  Blood tests.  Urine tests.  X-ray exams.  A computerized magnetic scan (magnetic resonance imaging, MRI).  A computerized X-ray scan (computed tomography, CT scan). TREATMENT  Treatment will depend on the cause. Treatment may include:  Management of an underlying medical or mental health condition.  Critical care or support in the hospital. HOME CARE INSTRUCTIONS   Only take over-the-counter or prescription medicines for pain, discomfort, or fever as directed by your caregiver.  Manage underlying conditions as directed by your caregiver.  Eat a healthy, well-balanced diet to maintain strength.  Join a support group or  prevention program to cope with the condition or trauma that caused the altered mental status. Ask your caregiver to help choose a program that works for you.  Follow up with your caregiver for further examination, therapy, or testing as directed. SEEK MEDICAL CARE IF:   You feel unwell or have chills.  You or your family notice a change in your behavior or your alertness.  You have trouble following your caregiver's treatment plan.  You have questions or concerns. SEEK IMMEDIATE MEDICAL CARE IF:   You have a rapid heartbeat or have chest pain.  You have difficulty breathing.  You have a fever.  You have a headache with a stiff neck.  You cough up blood.  You have blood in your urine or stool.  You have severe agitation or confusion. MAKE SURE YOU:   Understand these instructions.  Will watch your condition.  Will get help right away if you are not doing well or get worse. Document Released: 08/17/2009 Document Revised: 05/22/2011 Document Reviewed: 08/17/2009 [facility] Patient Information 2013 [place], [place].   Please follow up with Dr. Tonita.  I have left a message with our social worker to contact you.

## 2012-05-31 NOTE — ED Notes (Signed)
Patient more lethargic than normal per staff and family. Once patient in ambulance he states that he is angry and does not trust his family. Does not want to go back to the nursing home. Patient did not verbalize any other complaints to EMS.

## 2013-06-26 IMAGING — CR DG SHOULDER 2+V*R*
4 series · 4 of 4 positions shown · non-contrast
Comparison: Plain films 09/04/2011.

CLINICAL DATA: Fall, pain.

RIGHT SHOULDER - 2+ VIEW

[x shoulder ap right (1 of 4)]
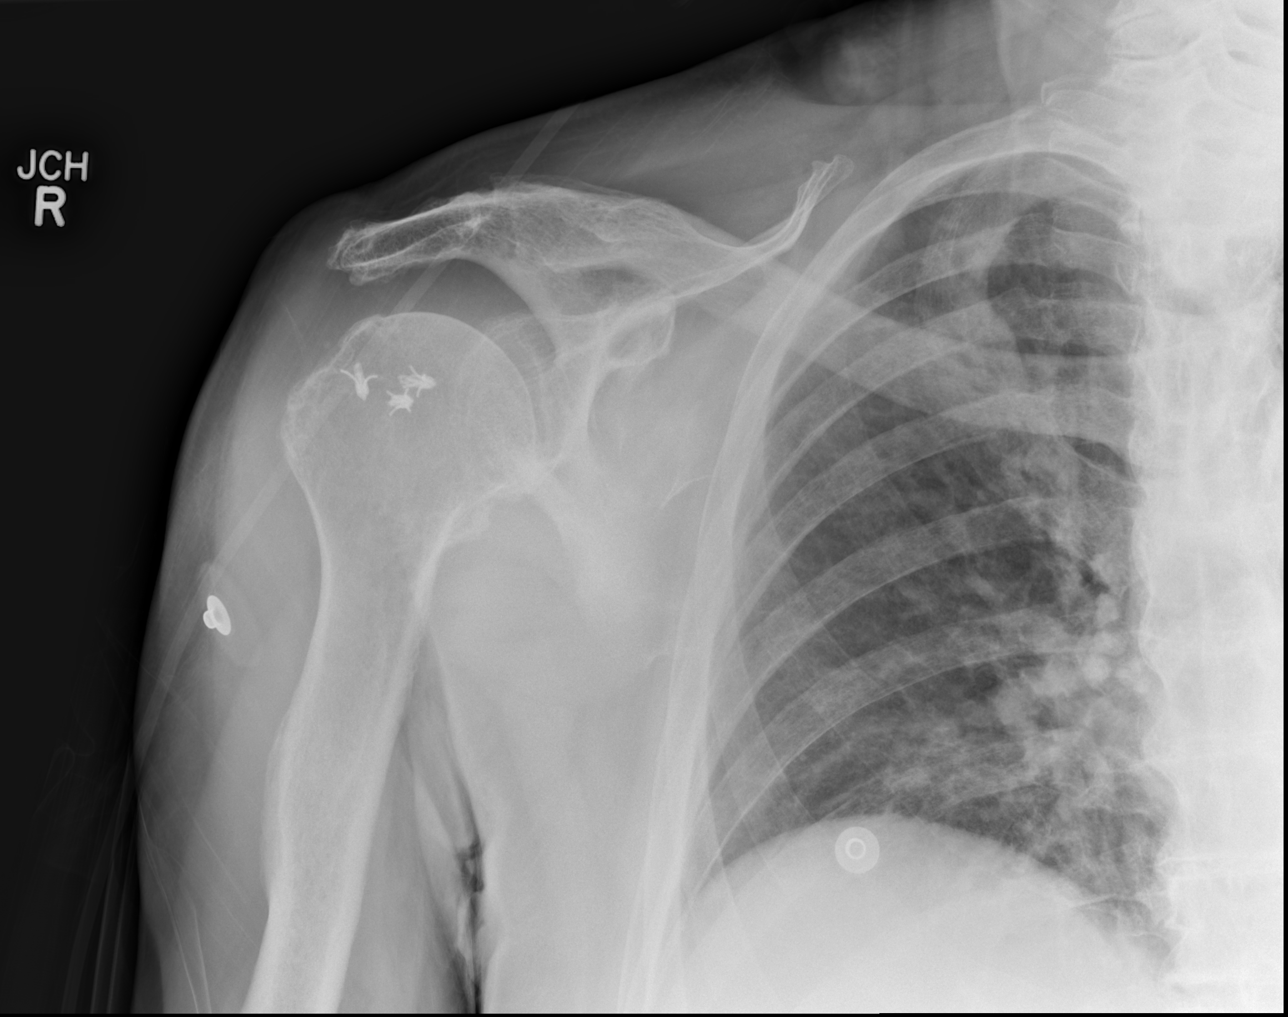

[x shoulder ap right (2 of 4)]
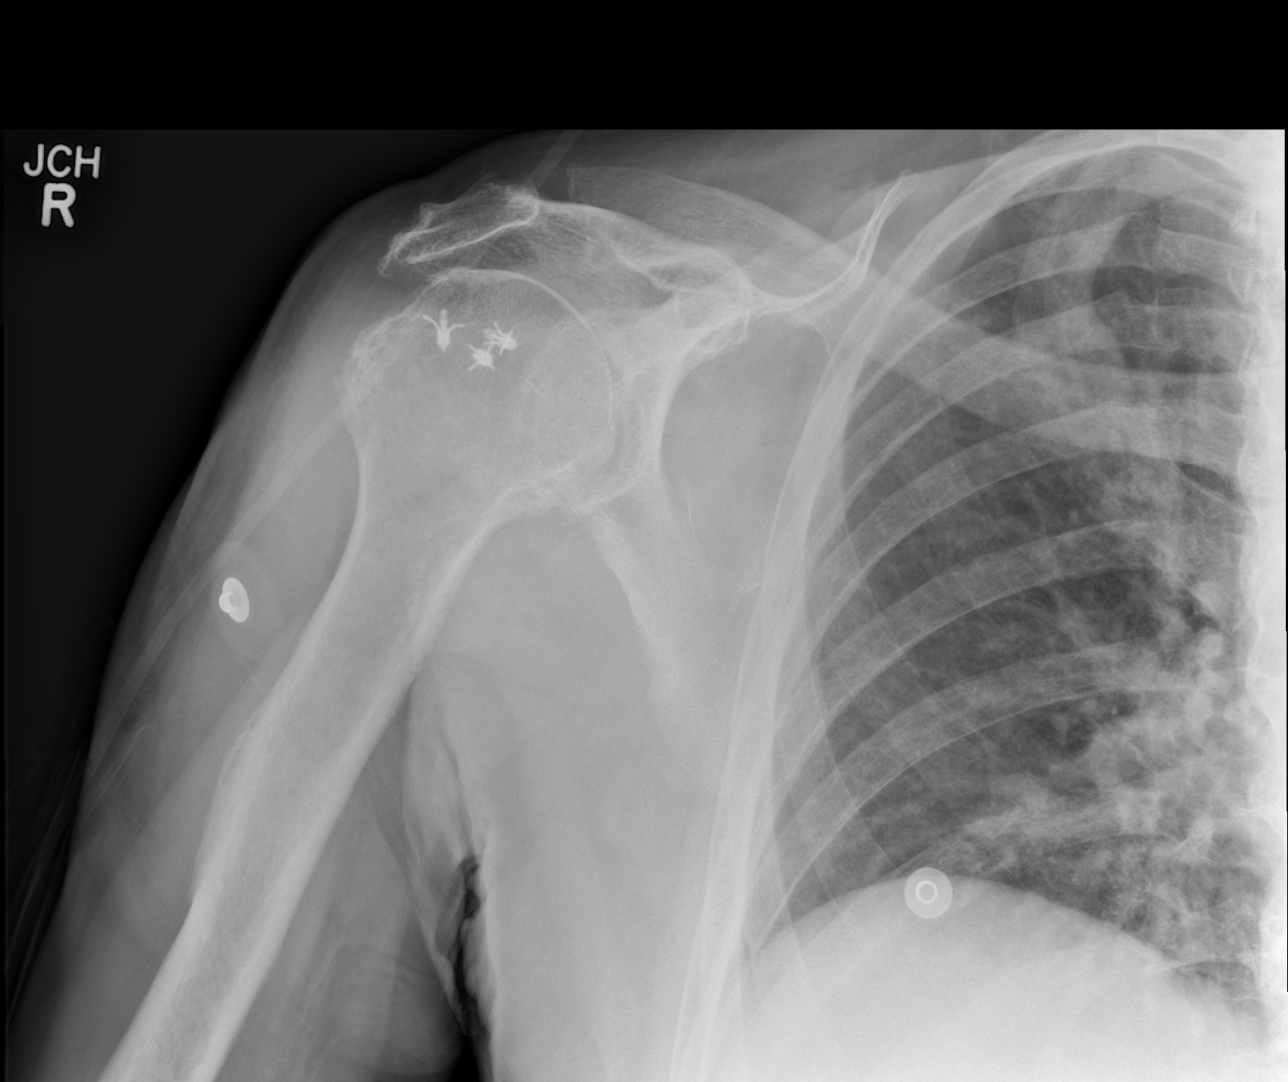

[x shoulder ap right (3 of 4)]
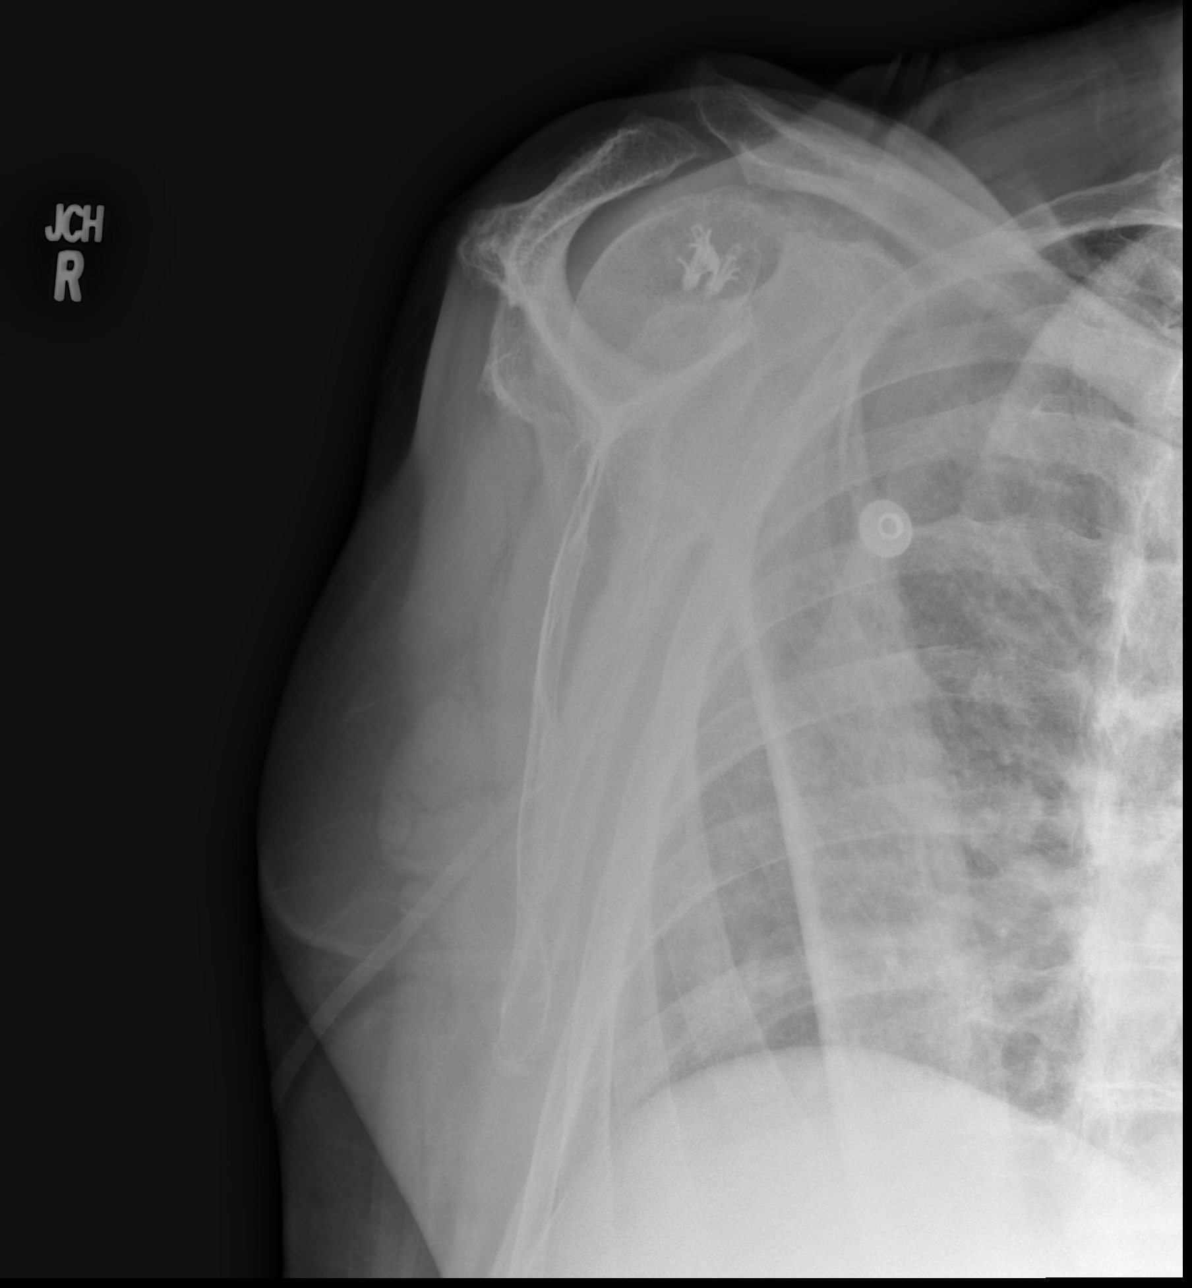

[x shoulder ap right (4 of 4)]
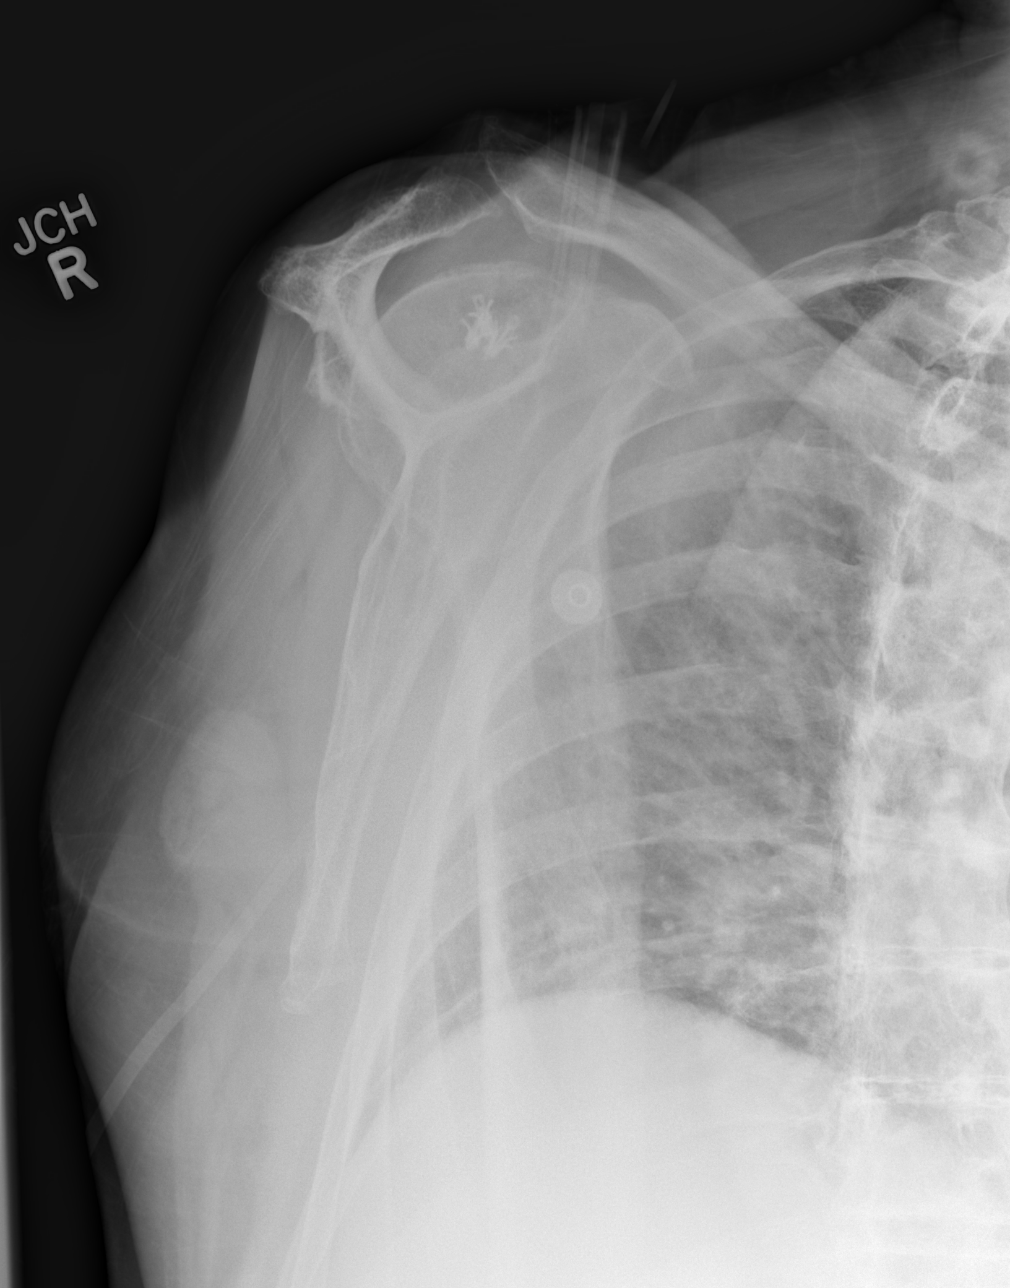

[4 of 4 positions shown; findings below may reference images not displayed]

FINDINGS: There is no fracture or dislocation.  Postoperative
change noted.  Glenohumeral degenerative disease is present.
Imaged right lung and ribs are unremarkable.
IMPRESSION: No acute finding.

## 2013-06-26 IMAGING — CT CT CERVICAL SPINE W/O CM
2 of 4 series · 4 of 14 positions shown, 5 images · non-contrast
Comparison: Head and cervical spine CT scan 10/26/2011.

CT HEAD

CLINICAL DATA: Status post fall.  Pain is.  Right.  No free pain.

CT HEAD WITHOUT CONTRAST
CT CERVICAL SPINE WITHOUT CONTRAST
TECHNIQUE: Multidetector CT imaging of the head and cervical spine
was performed following the standard protocol without intravenous
contrast.  Multiplanar CT image reconstructions of the cervical
spine were also generated.

[Series 4: c-spine st · axial · 0.40mm/px · z∈[+616,+664]mm · 2 of 74 slices shown]
[im 25/74  bone]
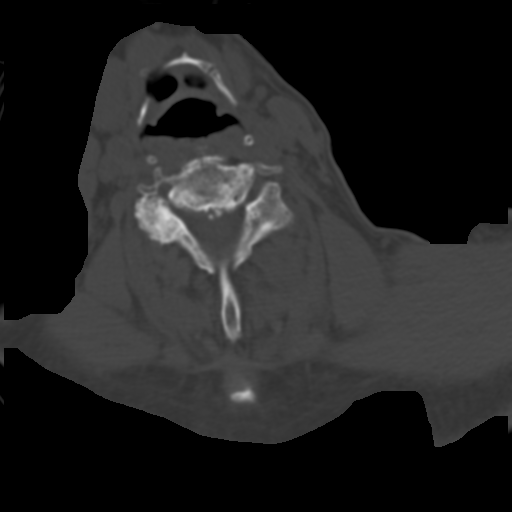
[im 49/74  bone]
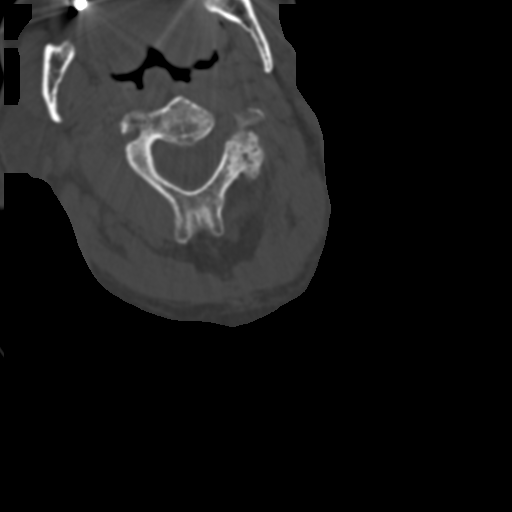

[Series 8: axial recon · axial · 0.23mm/px · z∈[+599,+644]mm · 2 of 73 slices shown, 3 images]
[im 25/73  soft-tissue]
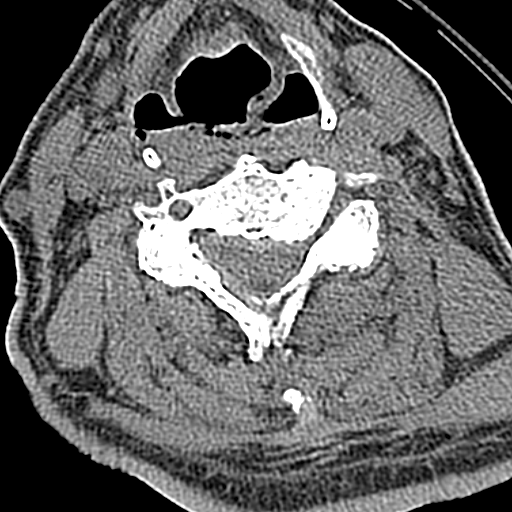
[im 25/73  bone]
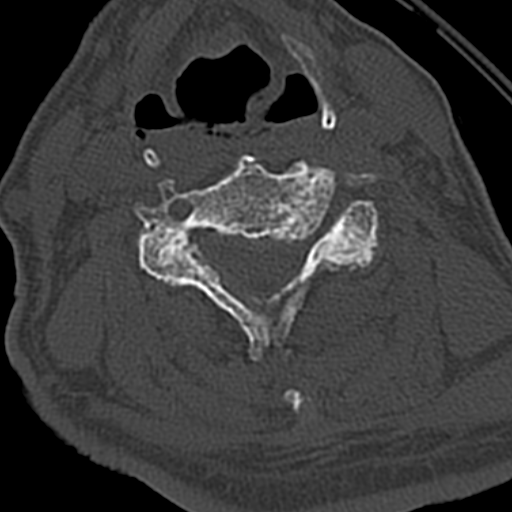
[im 49/73  bone]
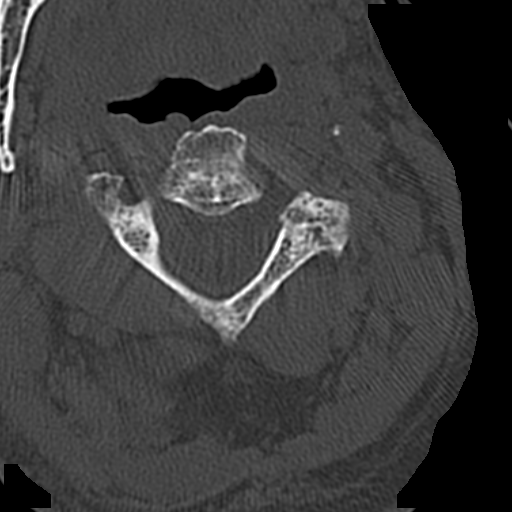

[4 of 14 positions shown; findings below may reference images not displayed]

FINDINGS: Marked cortical atrophy and extensive chronic
microvascular ischemic change are again seen.  There is a scalp
contusion over the left parietal bone, new since the prior study.
No evidence of acute intracranial abnormality including infarction,
hemorrhage, mass lesion, mass effect, midline shift or abnormal
extra-axial fluid collection.  No hydrocephalus or pneumocephalus.
Calvarium intact.
IMPRESSION: 1.  Scalp contusion over the left parietal bone.  No underlying
fracture or acute intracranial abnormality.
2.  Marked atrophy and chronic microvascular ischemic change.

CT CERVICAL SPINE
FINDINGS: Congenital failure fusion of the posterior arch of C1 is
noted.  There is no fracture or subluxation of the cervical spine.
Marked multilevel degenerative disease is unchanged in appearance.
Lung apices are clear.
IMPRESSION: No acute finding.  Marked multilevel degenerative disease.

## 2014-05-05 ENCOUNTER — Non-Acute Institutional Stay (SKILLED_NURSING_FACILITY): Payer: 59 | Admitting: Internal Medicine

## 2014-05-05 DIAGNOSIS — F03C0 Unspecified dementia, severe, without behavioral disturbance, psychotic disturbance, mood disturbance, and anxiety: Secondary | ICD-10-CM

## 2014-05-05 DIAGNOSIS — F0390 Unspecified dementia without behavioral disturbance: Secondary | ICD-10-CM

## 2014-05-05 DIAGNOSIS — G2579 Other drug induced movement disorders: Secondary | ICD-10-CM

## 2014-05-05 DIAGNOSIS — E039 Hypothyroidism, unspecified: Secondary | ICD-10-CM

## 2014-05-09 NOTE — Progress Notes (Addendum)
Patient ID: Robert Preston, male   DOB: 01/22/1923, 81 y.o.   MRN: [id number]               HISTORY & PHYSICAL  DATE:  05/05/2014                  FACILITY: [facility]                        LEVEL OF CARE:   SNF   CHIEF COMPLAINT:  Admission to SNF, post transfer from another skilled facility, [facility] in [place].    HISTORY OF PRESENT ILLNESS:  Robert Preston is a gentleman who was transferred to this facility, apparently to be closer to family.   There is not a lot of medical information that accompanies him.  It appears that he was in the [facility] system through 2014 and, therefore, I have a bit of information on him; but not really much lately.    It is stated that he has advanced dementia.  Nevertheless, the FL2 dated from 2014 states that he was semi-ambulatory with a walker and wheelchair.  He was listed as continent at that time of bowel, but not continent of bladder.    PAST MEDICAL HISTORY/PROBLEM LIST:                          "Cerebral attack".          Depression.    Mood disorder.    Insomnia.    Contracted at the knee.    Dementia.    Hypothyroidism.  On replacement.    History of BPH.  Status post transurethral resection.      History of arthritis.    "Neuromuscular disorder".    Carpal tunnel syndrome.    CURRENT MEDICATIONS:  Medication list is reviewed.           ASA 81 q.d.       Vitamin D3, 6000 daily.    Zoloft 50 mg daily.      Exelon 9.5, 1 patch every 24 hours.    Depakote 125 at night.    Synthroid 50 q.d.       Hytrin 2 mg daily.      Desyrel 75 at night p.r.n. for insomnia.    Tylenol p.r.n.      SOCIAL HISTORY:    I have no information on this man.                ADVANCED DIRECTIVES:  He is a DNR.                  FAMILY HISTORY:  Nothing currently available from any source.    REVIEW OF SYSTEMS:  Really not possible from the patient.    GENERAL:   Staff report he is eating and drinking well.  He is  doubly incontinent.    PHYSICAL EXAMINATION:         GENERAL APPEARANCE:  The patient is awake.  Can answer some questions, usually inaccurately.  HEENT:   MOUTH/THROAT:   I did not see any lesions.  Limited exam.   CHEST/RESPIRATORY:  Shallow air entry bilaterally.   No crackles or wheezes.   CARDIOVASCULAR:  CARDIAC:  He appears to be euvolemic.  Heart sounds are normal.   GASTROINTESTINAL:  LIVER/SPLEEN/KIDNEYS:  No liver, no spleen.  No tenderness.    ABDOMEN:  No masses.  GENITOURINARY:  BLADDER:   Bladder does not appear to be distended.   SCROTUM:  Almost looks like a petechial rash on his scrotum.  However, there is no testicular mass.  No edema.    SKIN:  INSPECTION:  No lesions are seen, which is quite surprising.   NEUROLOGICAL:   He is very rigid.  With putting your hand behind his head almost takes his entire torso off the bed.  In the extremities, it is difficult to sort this out from the type of rigidity you see in many people with advanced dementia (Gegenhalten's rigidity).  Nevertheless, I really believe there is something more here than this.   PSYCHIATRIC:   MENTAL STATUS:   As mentioned, he can answer some questions.   I do not know about the accuracy.  He said he was 39.  He is not aware of the year, the place.  Told me he was from [place], [place].  I do not see evidence of depression or delirium here.    ASSESSMENT/PLAN:                    Severe dementia.  The exact diagnosis here is uncertain.    Profound rigidity.  The differential here would be quite large, including the type of rigidity with many people with advanced dementia including Alzheimer's disease (gagenhalten's ridgidity).  As mentioned above, I think there is more here than this.  I almost wonder about a manifestation of serotonin syndrome, although he lacks the other manifestations of this including autonomic hyperactivity and clonus.  In fact, I cannot even do his reflexes enough to see if  there is clonus.  He probably has myoclonic jerking, however.  He is not diaphoretic.  There is not ocular clonus, no tremor, and no fever.   Nevertheless, he is on Desyrel, Zoloft, and Depakote, all of which probably should be reduced.    History of bilateral hip replacements.    Hypothyroidism.  On replacement.    I will check a TSH on him.    History of hyperlipidemia.  Not currently on any treatment.    History of BPH.  There is no evidence of bladder distention.    History of carpal tunnel release on the right.    My major concern here is the degree of muscular rigidity in this patient.  I have outlined my thoughts here.  This could be a usual type dementia associated with Gegenhalten's rigidity such as Alzheimer's disease, an atypical dementia complex, parkinsonism, and/or I wonder about an Mild serotonin syndrome.  I am going to adjust his medications, check his lab work including a CK.  Fortunately, he still appears to be eating, and there are no pressure areas that I can see.  I wonder whether the petechial area in his scrotum is due to tension over the scrotal area between his thighs which are also rigid.  As mentioned in my notes, I think this is well beyond anything I have seen in the usual types of advanced dementia.      CPT CODE: 96045

## 2014-05-19 ENCOUNTER — Non-Acute Institutional Stay (SKILLED_NURSING_FACILITY): Payer: 59 | Admitting: Internal Medicine

## 2014-05-19 DIAGNOSIS — F0390 Unspecified dementia without behavioral disturbance: Secondary | ICD-10-CM

## 2014-05-19 DIAGNOSIS — E039 Hypothyroidism, unspecified: Secondary | ICD-10-CM | POA: Diagnosis not present

## 2014-05-19 DIAGNOSIS — G2579 Other drug induced movement disorders: Secondary | ICD-10-CM | POA: Diagnosis not present

## 2014-05-19 DIAGNOSIS — F03C0 Unspecified dementia, severe, without behavioral disturbance, psychotic disturbance, mood disturbance, and anxiety: Secondary | ICD-10-CM

## 2014-05-23 NOTE — Progress Notes (Addendum)
Patient ID: Robert Preston, male   DOB: [birth year], 81 y.o.   MRN: [id number]                PROGRESS NOTE  DATE:  05/19/2014             FACILITY: [facility]                           LEVEL OF CARE:   SNF   Acute Visit                     CHIEF COMPLAINT:  Follow up admission from 05/05/2014.                   HISTORY OF PRESENT ILLNESS:  This is a patient whom I admitted from another skilled facility, [facility] in [place].    This was apparently done to be closer to family.  There is not a lot of medical information on the chart.    It is stated that he had advanced dementia, although his FL2 from 2014 stated that he was semi-ambulatory with a walker.    Lab work that I ordered on him last time showed a hemoglobin of 10.9, which is normochromic, normocytic.    His comprehensive metabolic panel was essentially normal.  Total CK normal.  TSH 1.492.    When I saw him two weeks ago, I noted really profound rigidity.  I thought there was more to this than the simple rigidity that accompanies many end-stage dementia patients, including those with Alzheimer's disease.  I discontinued his trazodone and Depakote, and reduced the Zoloft from 50 to 25.    REVIEW OF SYSTEMS:   Really not possible from the patient.  Staff report he is eating and drinking well.  He is doubly incontinent.     PHYSICAL EXAMINATION:   CHEST/RESPIRATORY:  Clear air entry bilaterally.    CARDIOVASCULAR:   CARDIAC:  Heart sounds are normal.  There are no murmurs.    NEUROLOGICAL:    SENSATION/STRENGTH:  He is much less rigid.   He is able to move both his arms.   SPEECH:  Almost talks in a fluent aphasia at some points.  This would make me wonder whether he has had a dominant hemisphere event.    ASSESSMENT/PLAN:                      Severe dementia.  Now, I am wondering about whether this is some form of receptive language problem with a fluent aphasia.    Profound rigidity.   I reduced a  lot of his medications last time.   He does seem better.  He is more verbal, albeit in a nonsensical way with paraphasias.     History of hypothyroidism.  On replacement.  His TSH was normal.    Mild normochromic, normocytic anemia.    I note that he has already transferred to [facility].  I wonder whether he was an [facility] patient in the other facility and, hence, there might be more information here. Specifically any Neuroimaging? Baseline mental status?

## 2014-06-23 ENCOUNTER — Non-Acute Institutional Stay (SKILLED_NURSING_FACILITY): Payer: 59 | Admitting: Internal Medicine

## 2014-06-23 DIAGNOSIS — F0390 Unspecified dementia without behavioral disturbance: Secondary | ICD-10-CM

## 2014-06-23 DIAGNOSIS — F03C0 Unspecified dementia, severe, without behavioral disturbance, psychotic disturbance, mood disturbance, and anxiety: Secondary | ICD-10-CM

## 2014-06-23 DIAGNOSIS — G9081 Serotonin syndrome: Secondary | ICD-10-CM

## 2014-06-23 DIAGNOSIS — G2579 Other drug induced movement disorders: Secondary | ICD-10-CM | POA: Diagnosis not present

## 2014-06-23 DIAGNOSIS — E039 Hypothyroidism, unspecified: Secondary | ICD-10-CM

## 2014-06-28 NOTE — Progress Notes (Addendum)
Patient ID: Robert Preston, male   DOB: 09-Sep-1922, 81 y.o.   MRN: [id number]                PROGRESS NOTE  DATE:  06/23/2014             FACILITY: [facility]           LEVEL OF CARE:   SNF   Routine Visit             CHIEF COMPLAINT:  Routine medical visit/[facility] visit.    HISTORY OF PRESENT ILLNESS:  This is a patient whom I initially admitted in February, coming from another facility.    When I initially admitted him, I noted profound rigidity.  I stopped his trazodone and Depakote, and reduced his Zoloft.  This appears to have improved the rigidity.  However, he has become much more verbal.  In fact, what he comes out with almost seems like a fluent aphasia.  He sits in the wheelchair talking nonsensically, but does not seem to respond to direct questions.    PAST MEDICAL HISTORY/PROBLEM LIST:           "Cerebral attack".    Depression.    Mood disorder.     Insomnia.    Knee contractures.     Dementia.    Hypothyroidism.    BPH.   Status post TURP.     Arthritis.    "Neuromuscular disorder".    Carpal tunnel syndrome.      SOCIAL HISTORY:            ADVANCED DIRECTIVES:  The patient is a DNR.      CURRENT MEDICATIONS:  Medication list is reviewed.           Synthroid 50 mcg daily.     ASA 81 q.d.      Vitamin D3, 6000 U daily.    Zoloft 50 q.d.       Exelon patch q.24.    Hytrin 2 q.d.        REVIEW OF SYSTEMS:   Not really possible from the patient.   Staff report that he is eating and drinking well.     PHYSICAL EXAMINATION:   GENERAL APPEARANCE:  He is awake, very verbose.    CHEST/RESPIRATORY:  Clear air entry bilaterally.    CARDIOVASCULAR:   CARDIAC:  Heart sounds are normal.  There are no murmurs.   He appears to be euvolemic.     GASTROINTESTINAL:   ABDOMEN:   LIVER/SPLEEN/KIDNEYS:  Not stigmata of chronic liver disease.  No asterixis.    ASSESSMENT/PLAN:                  Severe dementia.  The exact diagnosis here is unclear.      Profound rigidity on admission.  I think that is improved, and I wonder if this was a serotonin type syndrome in a mild form.    Hypothyroidism.   On replacement.    TSH is normal.    BPH.   There is no evidence of bladder obstruction.     The patient appears to be very stable.   He is more awake, albeit verbose on the reduced medications.

## 2014-07-21 ENCOUNTER — Non-Acute Institutional Stay (SKILLED_NURSING_FACILITY): Payer: Medicare Other | Admitting: Internal Medicine

## 2014-07-21 DIAGNOSIS — E039 Hypothyroidism, unspecified: Secondary | ICD-10-CM | POA: Diagnosis not present

## 2014-07-21 DIAGNOSIS — F0390 Unspecified dementia without behavioral disturbance: Secondary | ICD-10-CM

## 2014-07-21 DIAGNOSIS — F03C0 Unspecified dementia, severe, without behavioral disturbance, psychotic disturbance, mood disturbance, and anxiety: Secondary | ICD-10-CM

## 2014-07-26 NOTE — Progress Notes (Addendum)
Patient ID: Robert Preston, male   DOB: January 22, 1923, 81 y.o.   MRN: [id number]                PROGRESS NOTE  DATE:  07/21/2014             FACILITY: [facility]                       LEVEL OF CARE:   SNF   Routine Visit                 CHIEF COMPLAINT:  Routine visit/[facility] visit.    HISTORY OF PRESENT ILLNESS:  This is a patient whom I initially admitted in February, coming from another facility.    When I initially saw him, I thought he had mild serotonin syndrome and I made several medication adjustments.    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     He does have a history of stroke.  I wonder whether this was dominant hemisphere as a result of perhaps a left parietal lobe infarct.    In any case, I have not really tracked this down.    He has largely stabilized.  He has not been losing weight.  He came to us from another skilled facility.  Therefore, the history has not been easy to obtain.    PAST MEDICAL HISTORY/PROBLEM LIST:           "Cerebral attack."     Depression.    Insomnia.     Knee contracture.    Dementia.    Hypothyroidism.   BPH.  Status post TURP.     Osteoarthritis.    "Neuromuscular disorder."     Carpal tunnel syndrome.    CURRENT MEDICATIONS:  Medication list is reviewed.                                 Synthroid 50 q.d.     ASA 81 q.d.       Exelon patch 9.5 daily.    Zoloft 25 q.d.        Vitamin D3, 6000 U daily.    Vitamin B12, 1000 mcg daily.      Terazosin 2 mg at bedtime.     REVIEW OF SYSTEMS:   This is not possible from the patient.    Staff report he has been eating adequately.    PHYSICAL EXAMINATION:   VITAL SIGNS:     WEIGHT:  157 pounds.     CHEST/RESPIRATORY:  Exam is clear.        CARDIOVASCULAR:   CARDIAC:  Heart sounds are normal.   GASTROINTESTINAL:   LIVER/SPLEEN/KIDNEYS:  No liver, no spleen.  No tenderness.    No asterixis.      ASSESSMENT/PLAN:                Severe  dementia.  The exact diagnosis is unclear.  Perhaps multi-infarct state.     Hypothyroidism.  On replacement.  TSH is normal.    BPH.  There is no evidence of bladder obstruction.    This patient seems to be stable.  I have no additional plans to work up any of his current issues.  He is largely a long-term maintenance      CPT CODE: [id number]

## 2014-09-15 ENCOUNTER — Non-Acute Institutional Stay (SKILLED_NURSING_FACILITY): Payer: Medicare Other | Admitting: Internal Medicine

## 2014-09-15 DIAGNOSIS — F0390 Unspecified dementia without behavioral disturbance: Secondary | ICD-10-CM | POA: Diagnosis not present

## 2014-09-15 DIAGNOSIS — G20 Parkinson's disease: Secondary | ICD-10-CM | POA: Diagnosis not present

## 2014-09-15 DIAGNOSIS — F03C0 Unspecified dementia, severe, without behavioral disturbance, psychotic disturbance, mood disturbance, and anxiety: Secondary | ICD-10-CM

## 2014-09-21 NOTE — Progress Notes (Addendum)
Patient ID: Robert Preston, male   DOB: 1922-12-01, 81 y.o.   MRN: [id number]                PROGRESS NOTE  DATE:  09/15/2014           FACILITY: [facility]                                 LEVEL OF CARE:   SNF   Acute Visit                      CHIEF COMPLAINT:  Asked to review with regards to rigidity.    HISTORY OF PRESENT ILLNESS:  This is a man whom I admitted from another nursing home in February of this year.    When I saw him initially, I thought he had severe rigidity/?mild serotonin syndrome.  I reduced many of his psychoactive medications at the time including Zoloft, Depakote, Desyrel.  He seemed to improve somewhat.  At the time, you could lift his entire torso off the bed from the back of his head.  He did not have clonus or autonomic instability, however.    The differential of this, I also outlined in my initial note.  This would include the type of parkinsonism you get in many patients with terminal dementia of any description, Gegenhalten rigidity, or perhaps he has some of the specific types of dementia associated with parkinsonism, for example, Lewy body dementia, etc.  Given the severity of his dementia, this is all largely academic.       CURRENT MEDICATIONS:  Medication list is reviewed.            Synthroid 50 mcg daily.     ASA 81 q.d.       Exelon patch 9.5 q.24.    Zoloft 25 q.d.      Ferrous sulfate 325 daily.      Vitamin B12, 1000 daily.     Hytrin 2 mg at h.s.       PHYSICAL EXAMINATION:   GENERAL APPEARANCE:  The patient does not look to be in any distress.    CHEST/RESPIRATORY:  Exam is clear.        CARDIOVASCULAR:   CARDIAC:  Heart sounds are normal.   GASTROINTESTINAL:   LIVER/SPLEEN/KIDNEYS:  No liver, no spleen.  No tenderness.   No asterixis.   NEUROLOGICAL:   He will not cooperate.  However, by simple range of motion, I do not think he has much increase in tone.  Certainly, if you move his arm through an increased range of motion  at the elbow and shoulder, there is marked rigidity here.  The rigidity in his lower extremities is more impressive.     ASSESSMENT/PLAN:                  Severe dementia.  The exact diagnosis here is unclear.  He was said to have had a stroke in the past and I wondered whether he had had a dominant hemisphere stroke as he seems to have a fluent aphasia.  Nevertheless, I have not tracked this down.    Rigidity.  I do not think this is as bad as when he first came into the facility.  I suspect this is largely a Gegenhalten type rigidity which can be an accompaniment of any dementia of an advanced degree. Specifically do no believe this  is parkinsonism primary or secondary   CPT CODE: 16109

## 2014-09-29 ENCOUNTER — Non-Acute Institutional Stay (SKILLED_NURSING_FACILITY): Payer: Medicare Other | Admitting: Internal Medicine

## 2014-09-29 DIAGNOSIS — H00016 Hordeolum externum left eye, unspecified eyelid: Secondary | ICD-10-CM | POA: Diagnosis not present

## 2014-10-01 NOTE — Progress Notes (Addendum)
Patient ID: Robert Preston, male   DOB: 11-Sep-1922, 81 y.o.   MRN: [id number]                PROGRESS NOTE  DATE:  09/29/2014          FACILITY: [facility]                     LEVEL OF CARE:   SNF   Acute Visit              CHIEF COMPLAINT:  Lesion on the inferior eyelid.     HISTORY OF PRESENT ILLNESS:  It was brought to my attention that Robert Preston has developed a raised swelling on the inferior eyelid.  This has been present for a week.  I think orders were given by the [facility] nurse practitioner for [facility].  I do not see a relevant note from them.    PHYSICAL EXAMINATION:   HEENT:   EYES:  A fairly substantial raised swelling with a blackened surface over this area.  This was gently cleaned and, in the process of this, ruptured with serosanguineous drainage.  A culture of this was taken.  There appears to be swelling of the entire lower lid.       ASSESSMENT/PLAN:    This is most reminiscent of a hordeolum.  I am somewhat concerned about coexistent cellulitis.  When I first looked at this, I was fearful about a basal cell carcinoma.  However, with the expressed drainage, I am less concerned although this will need careful follow-up.  Certainly, benign or malignant, lesions in this area are not impossible.

## 2014-10-03 ENCOUNTER — Non-Acute Institutional Stay (SKILLED_NURSING_FACILITY): Payer: Medicare Other | Admitting: Internal Medicine

## 2014-10-03 DIAGNOSIS — F0390 Unspecified dementia without behavioral disturbance: Secondary | ICD-10-CM

## 2014-10-03 DIAGNOSIS — F03C0 Unspecified dementia, severe, without behavioral disturbance, psychotic disturbance, mood disturbance, and anxiety: Secondary | ICD-10-CM

## 2014-10-03 DIAGNOSIS — G20 Parkinson's disease: Secondary | ICD-10-CM | POA: Diagnosis not present

## 2014-10-06 NOTE — Progress Notes (Signed)
Patient ID: Robert Preston, male   DOB: 07-Aug-1922, 81 y.o.   MRN: [id number]                PROGRESS NOTE  DATE:  10/03/2014          FACILITY: [facility]                          LEVEL OF CARE:   SNF   Routine Visit                CHIEF COMPLAINT:  Routine visit to follow medical issues/[facility] visit.      HISTORY OF PRESENT ILLNESS:  This is a patient whom I admitted to the building in February 2016, coming from another facility.      When I first saw him, I thought he had mild serotonin syndrome and made several medication adjustments.  He seemed to temporarily improve.    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  He does have a history of a stroke and I have wondered whether this was dominant hemisphere.  However, I have not been able to gain access to any information and I have not really tracked this down as it would largely be academic.    I still episodically see him because of rigidity.  I do not think he has Parkinson's disease, primary or secondary.  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    There really have been no major issues.  He apparently eats 75%.  He has gradually been losing weight over the course of the year.  Most recent weight was 154 pounds.    CURRENT MEDICATIONS:  Medication list is reviewed.    REVIEW OF SYSTEMS:   Not possible because of dementia.      PHYSICAL EXAMINATION:   VITAL SIGNS:     TEMPERATURE:  96.9.     PULSE:  76.   RESPIRATIONS:  18.    BLOOD PRESSURE:  105/64.     CHEST/RESPIRATORY:  Clear air entry bilaterally.    CARDIOVASCULAR:   CARDIAC:  Heart sounds are normal.  There are no murmurs.    GASTROINTESTINAL:   ABDOMEN:  Soft, with no tenderness.     LIVER/SPLEEN/KIDNEYS:  No liver, no spleen.   NEUROLOGICAL:   Once again, marked increase in tone, even nuchal rigidity.    ASSESSMENT/PLAN:               Advanced dementia.  Possibly multi-infarct.  He appears to have a fluent aphasia.  I have not  worked this up further.     Generalized rigidity.  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  I do not think this is parkinsonism, primary or secondary or any of its close relations.    Generalized osteoarthritis.    The patient is a DNR status.

## 2014-12-22 ENCOUNTER — Non-Acute Institutional Stay (SKILLED_NURSING_FACILITY): Payer: Medicare Other | Admitting: Internal Medicine

## 2014-12-22 DIAGNOSIS — F03C0 Unspecified dementia, severe, without behavioral disturbance, psychotic disturbance, mood disturbance, and anxiety: Secondary | ICD-10-CM

## 2014-12-22 DIAGNOSIS — F0390 Unspecified dementia without behavioral disturbance: Secondary | ICD-10-CM

## 2014-12-22 DIAGNOSIS — G218 Other secondary parkinsonism: Secondary | ICD-10-CM | POA: Diagnosis not present

## 2014-12-22 DIAGNOSIS — E039 Hypothyroidism, unspecified: Secondary | ICD-10-CM

## 2014-12-29 NOTE — Progress Notes (Addendum)
Patient ID: Robert Preston, male   DOB: 1922-06-20, 81 y.o.   MRN: [id number]                PROGRESS NOTE  DATE:  12/22/2014         FACILITY: [facility]                  LEVEL OF CARE:   SNF   Routine Visit            CHIEF COMPLAINT:  Routine visit to follow medical issues/[facility] visit/review of [facility] records.       HISTORY OF PRESENT ILLNESS:  This is a patient whom I admitted to the building in February 2016, coming from another facility.     When I first saw him, I thought he had mild serotonin syndrome and made several medication adjustments.  He seemed to temporarily improve.    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  He does have a history of stroke and I wonder if this was in the dominant hemisphere.  However, I have never been able to get information on him.     He was started on Sinemet, I think out of fear that he might have Parkinson's disease.  I really do not think he does.  I think this is a Gegenhalten type of rigidity.  I do not believe the Sinemet did any help here.    He has been gradually losing weight, eating roughly 75% of his meals.    PAST MEDICAL HISTORY/PROBLEM LIST:            Stroke.    Depression.    Insomnia.      Knee contractures.    Dementia.    Hypothyroidism.   On replacement.    BPH.  Status post transurethral resection.    History of arthritis.    Carpal tunnel syndrome.    SOCIAL HISTORY:             ADVANCED DIRECTIVES:  The patient is a DNR.     CURRENT MEDICATIONS:  Medication list is reviewed.                  Synthroid 50 q.d.      ASA 81 q.d.      Exelon 9.5, 1 patch every 24 hours.     Zoloft 25 q.d.      Vitamin D3, 6000 U daily.      Vitamin B12, 1000 mcg monthly.     Baclofen 10 b.i.d.      Hytrin 2 mg at night.     REVIEW OF SYSTEMS:   Not possible from the patient secondary to the advanced state of his dementia.   GENERAL:  However, current weight is 159 pounds on 12/16/2014.   He appears to have stabilized in terms of his weight after a gradual weight loss down to 152 pounds in June.     PHYSICAL EXAMINATION:   GENERAL APPEARANCE:  He does look thinner than I am used to seeing.   He is awake.    CHEST/RESPIRATORY:  Clear air entry bilaterally.    CARDIOVASCULAR:   CARDIAC:  Heart sounds are normal.  There are no murmurs.    GASTROINTESTINAL:   ABDOMEN:  No tenderness.     LIVER/SPLEEN/KIDNEYS:  No liver, no spleen.   CIRCULATION:   EDEMA/VARICOSITIES:  Extremities:  No edema.    NEUROLOGICAL:   He is rigid when you try to  move him through any range.   PSYCHIATRIC:   MENTAL STATUS:  He answers some questions.  Most of it is inaccurate.  I see no evidence of depression or delirium.     ASSESSMENT/PLAN:          Severe dementia.  The exact diagnosis here is unclear.     Profound rigidity.  I think this is an end-stage result of dementia.  There is no evidence that he has Parkinson's disease.     Hypothyroidism.  On replacement.   This is stable.    History of BPH.  There is no evidence of any bladder distention.

## 2015-04-15 ENCOUNTER — Non-Acute Institutional Stay (SKILLED_NURSING_FACILITY): Payer: Medicare Other | Admitting: Internal Medicine

## 2015-04-15 DIAGNOSIS — F0390 Unspecified dementia without behavioral disturbance: Secondary | ICD-10-CM

## 2015-04-15 DIAGNOSIS — R5381 Other malaise: Secondary | ICD-10-CM

## 2015-04-15 DIAGNOSIS — L89152 Pressure ulcer of sacral region, stage 2: Secondary | ICD-10-CM | POA: Diagnosis not present

## 2015-04-15 DIAGNOSIS — I10 Essential (primary) hypertension: Secondary | ICD-10-CM

## 2015-04-19 ENCOUNTER — Encounter: Payer: Self-pay | Admitting: Internal Medicine

## 2015-04-19 DIAGNOSIS — L89152 Pressure ulcer of sacral region, stage 2: Secondary | ICD-10-CM | POA: Insufficient documentation

## 2015-04-19 NOTE — Progress Notes (Signed)
[facility] Room Number: 106 B  Place of Service: SNF (31)     Allergies  Allergen Reactions  . Morphine And Related Other (See Comments)    Very aggressive and agitated  . Penicillins Hives    Chief Complaint  Patient presents with  . Acute Visit    HPI:  Patient was seen acutely for decline in condition. He is followed regularly by the nurse practitioner for [facility].  Patient's intake is quite poor. He is not consuming enough food or fluids to sustain himself. He has no CODE BLUE. He has end-stage dementia. Staff reports that he is pocketing medications.  He has a pressure ulcer of the coccyx measuring 3.3 x 2.0 x 0 1 cm with some gray slough covering the wound. Wound care nurses cleansing daily and using Santyl ointment. Foam dressings are changed every 2 days on this regimen.  Medications: Patient's Medications  New Prescriptions   No medications on file  Previous Medications   ASPIRIN EC 81 MG TABLET    Take 81 mg by mouth every morning.    BACLOFEN (LIORESAL) 10 MG TABLET    Take 10 mg by mouth 2 (two) times daily.   LEVOTHYROXINE (SYNTHROID, LEVOTHROID) 50 MCG TABLET    Take 50 mcg by mouth every morning.    MORPHINE (ROXANOL) 20 MG/ML CONCENTRATED SOLUTION    Take 0.5 mg by mouth every 4 (four) hours as needed for severe pain.   TERAZOSIN (HYTRIN) 2 MG CAPSULE    Take 2 mg by mouth at bedtime.  Modified Medications   No medications on file  Discontinued Medications   CHOLECALCIFEROL (VITAMIN D) 1000 UNITS TABLET    Take 1,000 Units by mouth every morning. Takes also with 5000 units tablets   CHOLECALCIFEROL (VITAMIN D-3) 5000 UNITS TABS    Take 1 tablet by mouth every morning.   DIVALPROEX (DEPAKOTE) 250 MG DR TABLET    Take 250 mg by mouth 2 (two) times daily. At 8am and 8pm   DONEPEZIL (ARICEPT) 10 MG TABLET    Take 10 mg by mouth every morning.    FINASTERIDE (PROSCAR) 5 MG TABLET    Take 5 mg by mouth every morning.    LORATADINE (CLARITIN) 10 MG TABLET    Take 10 mg by mouth every morning.    MEMANTINE (NAMENDA) 10 MG TABLET    Take 10 mg by mouth every morning.    SERTRALINE (ZOLOFT) 50 MG TABLET    Take 50 mg by mouth every morning.    TAMSULOSIN HCL (FLOMAX) 0.4 MG CAPS    Take 0.4 mg by mouth every morning.      Review of Systems  Constitutional: Negative for fever, activity change, appetite change and fatigue.       Thin. Poor intake. Declining.  HENT: Negative for congestion, ear pain, hearing loss, rhinorrhea, sore throat, tinnitus, trouble swallowing and voice change.   Eyes:       Corrective lenses  Respiratory: Negative for cough, choking, chest tightness, shortness of breath and wheezing.   Cardiovascular: Negative for chest pain, palpitations and leg swelling.  Gastrointestinal: Negative for nausea, abdominal pain, diarrhea, constipation and abdominal distention.  Endocrine: Negative for cold intolerance, heat intolerance, polydipsia, polyphagia and polyuria.  Genitourinary: Negative for dysuria, urgency, frequency and testicular pain.       Not incontinent  Musculoskeletal: Negative for myalgias, back pain, arthralgias, gait problem and neck pain.  Skin: Negative for color  change, pallor and rash.       History of sacral decubitus  Allergic/Immunologic: Negative.   Neurological: Positive for weakness. Negative for dizziness, tremors, syncope, speech difficulty, numbness and headaches.       Severe dementia  Hematological: Negative for adenopathy. Does not bruise/bleed easily.  Psychiatric/Behavioral: Positive for confusion. Negative for hallucinations, behavioral problems, sleep disturbance and decreased concentration. The patient is not nervous/anxious.     Filed Vitals:   04/15/15 1121  BP: 132/72  Pulse: 78  Temp: 98.2 F (36.8 C)  TempSrc: Oral  Resp: 18   Wt Readings from Last 3 Encounters:  11/12/11 172 lb 12.8 oz (78.382 kg)  09/03/11 182 lb (82.555 kg)  05/04/11 180 lb  (81.647 kg)    There is no weight on file to calculate BMI.  Physical Exam  Constitutional: He is oriented to person, place, and time. He appears well-developed and well-nourished. No distress.  Moribund appearing  HENT:  Right Ear: External ear normal.  Left Ear: External ear normal.  Nose: Nose normal.  Mouth/Throat: Oropharynx is clear and moist. No oropharyngeal exudate.  Eyes: Conjunctivae and EOM are normal. Pupils are equal, round, and reactive to light.  Neck: No JVD present. No tracheal deviation present. No thyromegaly present.  Cardiovascular: Normal rate, regular rhythm, normal heart sounds and intact distal pulses.  Exam reveals no gallop and no friction rub.   No murmur heard. Pulmonary/Chest: No respiratory distress. He has no wheezes. He has no rales. He exhibits no tenderness.  Abdominal: He exhibits no distension and no mass. There is no tenderness.  Musculoskeletal: Normal range of motion. He exhibits no edema or tenderness.  Lymphadenopathy:    He has no cervical adenopathy.  Neurological: He is alert and oriented to person, place, and time. He has normal reflexes. No cranial nerve deficit. Coordination normal.  End-stage dementia  Skin: No rash noted. No erythema. No pallor.  Sacral decubitus with measurements noted above     Labs reviewed: Lab Summary Latest Ref Rng 05/31/2012 04/15/2012 12/29/2011  Hemoglobin 13.0 - 17.0 g/dL 10.7(L) 11.9(L) 11.2(L)  Hematocrit 39.0 - 52.0 % 32.5(L) 35.8(L) 33.0(L)  White count 4.0 - 10.5 K/uL 7.1 5.7 (None)  Platelet count 150 - 400 K/uL 215 206 (None)  Sodium 135 - 145 mEq/L 139 138 145  Potassium 3.5 - 5.1 mEq/L 4.3 4.1 3.7  Calcium 8.4 - 10.5 mg/dL 8.9 9.2 (None)  Phosphorus - (None) (None) (None)  Creatinine 0.50 - 1.35 mg/dL 0.95 1.01 1.10  AST 0 - 37 U/L 20 22 (None)  Alk Phos 39 - 117 U/L 73 91 (None)  Bilirubin 0.3 - 1.2 mg/dL 0.2(L) 0.3 (None)  Glucose 70 - 99 mg/dL 89 110(H) 76  Cholesterol - (None) (None)  (None)  HDL cholesterol - (None) (None) (None)  Triglycerides - (None) (None) (None)  LDL Direct - (None) (None) (None)  LDL Calc - (None) (None) (None)  Total protein 6.0 - 8.3 g/dL 6.7 6.5 (None)  Albumin 3.5 - 5.2 g/dL 3.2(L) 3.4(L) (None)   Lab Results  Component Value Date   TSH 1.024 09/04/2011   Lab Results  Component Value Date   BUN 22 05/31/2012   BUN 23 04/15/2012   BUN 20 12/29/2011   Lab Results  Component Value Date   CREATININE 0.95 05/31/2012   CREATININE 1.01 04/15/2012   CREATININE 1.10 12/29/2011   No results found for: HGBA1C     Assessment/Plan  1. Dementia, without behavioral disturbance End stage in  declining condition with poor intake  2. HTN (hypertension), benign controlled  3. Debility Inadequate intake. DO NOT RESUSCITATE.  4. Decubitus ulcer of sacral region, stage 2 Skin breakdown related to overall poor condition in declining state.  This man has a very poor prognosis at this point. I expect him to die in the near future.

## 2015-04-27 ENCOUNTER — Other Ambulatory Visit: Payer: Self-pay | Admitting: *Deleted

## 2015-04-27 MED ORDER — AMBULATORY NON FORMULARY MEDICATION: Status: AC

## 2015-04-27 NOTE — Telephone Encounter (Signed)
[facility] 

## 2015-05-12 DEATH — deceased
